# Patient Record
Sex: Male | Born: 1965 | Hispanic: Yes | Marital: Single | Smoking: Never smoker
Health system: Southern US, Community
[De-identification: ages and names within clinical notes are randomized; demographics above are authoritative.]

## PROBLEM LIST (undated history)

## (undated) DIAGNOSIS — E785 Hyperlipidemia, unspecified: Secondary | ICD-10-CM

## (undated) DIAGNOSIS — E875 Hyperkalemia: Secondary | ICD-10-CM

## (undated) DIAGNOSIS — R748 Abnormal levels of other serum enzymes: Secondary | ICD-10-CM

## (undated) DIAGNOSIS — R809 Proteinuria, unspecified: Secondary | ICD-10-CM

## (undated) DIAGNOSIS — Z6826 Body mass index (BMI) 26.0-26.9, adult: Secondary | ICD-10-CM

## (undated) DIAGNOSIS — R7989 Other specified abnormal findings of blood chemistry: Secondary | ICD-10-CM

## (undated) DIAGNOSIS — Z1331 Encounter for screening for depression: Secondary | ICD-10-CM

## (undated) DIAGNOSIS — R000 Tachycardia, unspecified: Secondary | ICD-10-CM

## (undated) DIAGNOSIS — E119 Type 2 diabetes mellitus without complications: Secondary | ICD-10-CM

## (undated) DIAGNOSIS — R5382 Chronic fatigue, unspecified: Secondary | ICD-10-CM

## (undated) DIAGNOSIS — R9431 Abnormal electrocardiogram [ECG] [EKG]: Secondary | ICD-10-CM

## (undated) DIAGNOSIS — K8020 Calculus of gallbladder without cholecystitis without obstruction: Secondary | ICD-10-CM

## (undated) DIAGNOSIS — E1129 Type 2 diabetes mellitus with other diabetic kidney complication: Secondary | ICD-10-CM

## (undated) DIAGNOSIS — U099 Post covid-19 condition, unspecified: Secondary | ICD-10-CM

## (undated) DIAGNOSIS — Z6827 Body mass index (BMI) 27.0-27.9, adult: Secondary | ICD-10-CM

## (undated) DIAGNOSIS — I10 Essential (primary) hypertension: Secondary | ICD-10-CM

## (undated) HISTORY — DX: Hyperlipidemia, unspecified: E78.5

## (undated) HISTORY — DX: Post covid-19 condition, unspecified: U09.9

## (undated) HISTORY — DX: Abnormal electrocardiogram (ECG) (EKG): R94.31

## (undated) HISTORY — DX: Abnormal levels of other serum enzymes: R74.8

## (undated) HISTORY — DX: Proteinuria, unspecified: R80.9

## (undated) HISTORY — DX: Body mass index (BMI) 27.0-27.9, adult: Z68.27

## (undated) HISTORY — DX: Type 2 diabetes mellitus with other diabetic kidney complication: E11.29

## (undated) HISTORY — DX: Encounter for screening for depression: Z13.31

## (undated) HISTORY — DX: Essential (primary) hypertension: I10

## (undated) HISTORY — DX: Other specified abnormal findings of blood chemistry: R79.89

## (undated) HISTORY — DX: Calculus of gallbladder without cholecystitis without obstruction: K80.20

## (undated) HISTORY — DX: Tachycardia, unspecified: R00.0

## (undated) HISTORY — DX: Body mass index (BMI) 26.0-26.9, adult: Z68.26

## (undated) HISTORY — DX: Hyperkalemia: E87.5

## (undated) HISTORY — DX: Chronic fatigue, unspecified: R53.82

---

## 2019-03-16 DIAGNOSIS — E119 Type 2 diabetes mellitus without complications: Secondary | ICD-10-CM

## 2019-03-16 DIAGNOSIS — I10 Essential (primary) hypertension: Secondary | ICD-10-CM

## 2019-03-16 DIAGNOSIS — N132 Hydronephrosis with renal and ureteral calculous obstruction: Secondary | ICD-10-CM

## 2019-08-20 ENCOUNTER — Emergency Department (HOSPITAL_BASED_OUTPATIENT_CLINIC_OR_DEPARTMENT_OTHER)
Admission: EM | Admit: 2019-08-20 | Discharge: 2019-08-20 | Disposition: A | Discharge status: Home / Self Care | Payer: Worker's Compensation | Attending: Emergency Medicine | Admitting: Emergency Medicine

## 2019-08-20 ENCOUNTER — Emergency Department (HOSPITAL_BASED_OUTPATIENT_CLINIC_OR_DEPARTMENT_OTHER): Payer: Worker's Compensation

## 2019-08-20 ENCOUNTER — Encounter (HOSPITAL_BASED_OUTPATIENT_CLINIC_OR_DEPARTMENT_OTHER): Payer: Self-pay | Admitting: Emergency Medicine

## 2019-08-20 ENCOUNTER — Other Ambulatory Visit: Payer: Self-pay

## 2019-08-20 DIAGNOSIS — Y9289 Other specified places as the place of occurrence of the external cause: Secondary | ICD-10-CM | POA: Diagnosis not present

## 2019-08-20 DIAGNOSIS — S60944A Unspecified superficial injury of right ring finger, initial encounter: Secondary | ICD-10-CM | POA: Diagnosis present

## 2019-08-20 DIAGNOSIS — E119 Type 2 diabetes mellitus without complications: Secondary | ICD-10-CM | POA: Diagnosis not present

## 2019-08-20 DIAGNOSIS — S62664A Nondisplaced fracture of distal phalanx of right ring finger, initial encounter for closed fracture: Secondary | ICD-10-CM | POA: Insufficient documentation

## 2019-08-20 DIAGNOSIS — Y999 Unspecified external cause status: Secondary | ICD-10-CM | POA: Diagnosis not present

## 2019-08-20 DIAGNOSIS — L03011 Cellulitis of right finger: Secondary | ICD-10-CM | POA: Diagnosis not present

## 2019-08-20 DIAGNOSIS — Y9389 Activity, other specified: Secondary | ICD-10-CM | POA: Insufficient documentation

## 2019-08-20 DIAGNOSIS — W499XXA Exposure to other inanimate mechanical forces, initial encounter: Secondary | ICD-10-CM | POA: Insufficient documentation

## 2019-08-20 DIAGNOSIS — S62639A Displaced fracture of distal phalanx of unspecified finger, initial encounter for closed fracture: Secondary | ICD-10-CM

## 2019-08-20 HISTORY — DX: Type 2 diabetes mellitus without complications: E11.9

## 2019-08-20 LAB — CBG MONITORING, ED: Glucose-Capillary: 335 mg/dL — ABNORMAL HIGH (ref 70–99)

## 2019-08-20 IMAGING — CR DG FINGER RING 2+V*R*
3 series · 3 of 3 positions shown · non-contrast
Comparison: None.

CLINICAL DATA: Crush injury.  Pain.

EXAM:
RIGHT RING FINGER 2+V

[x finger pa right]
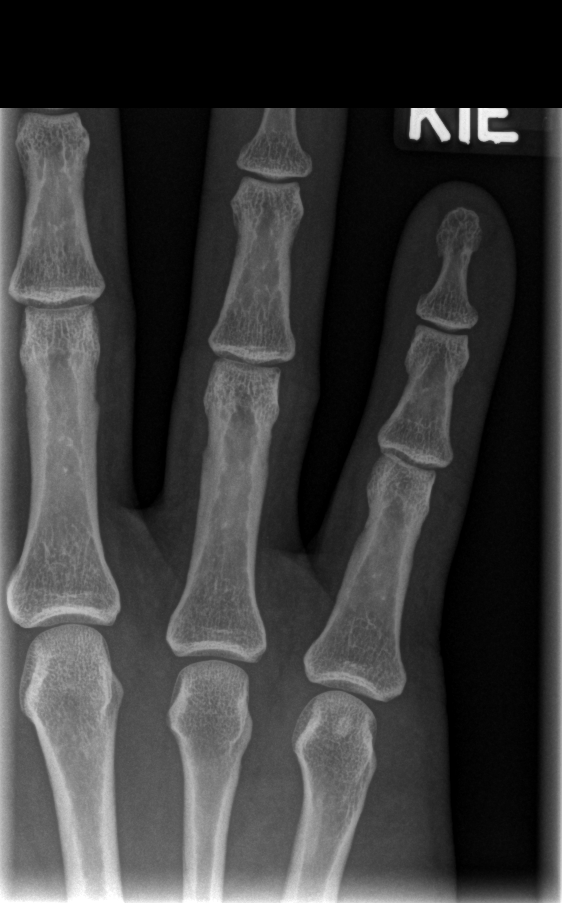

[x finger obl. right]
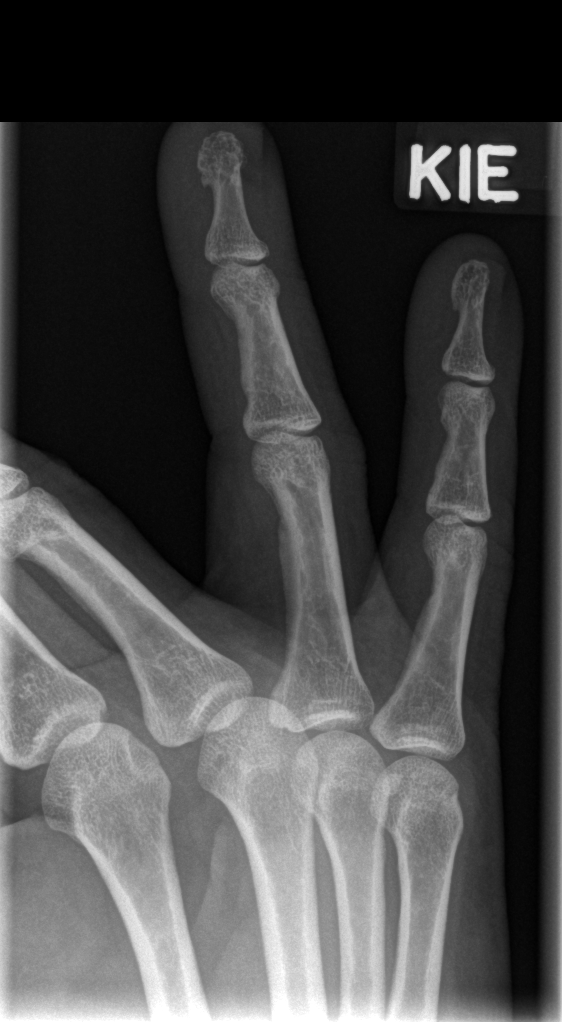

[x finger lateral right]
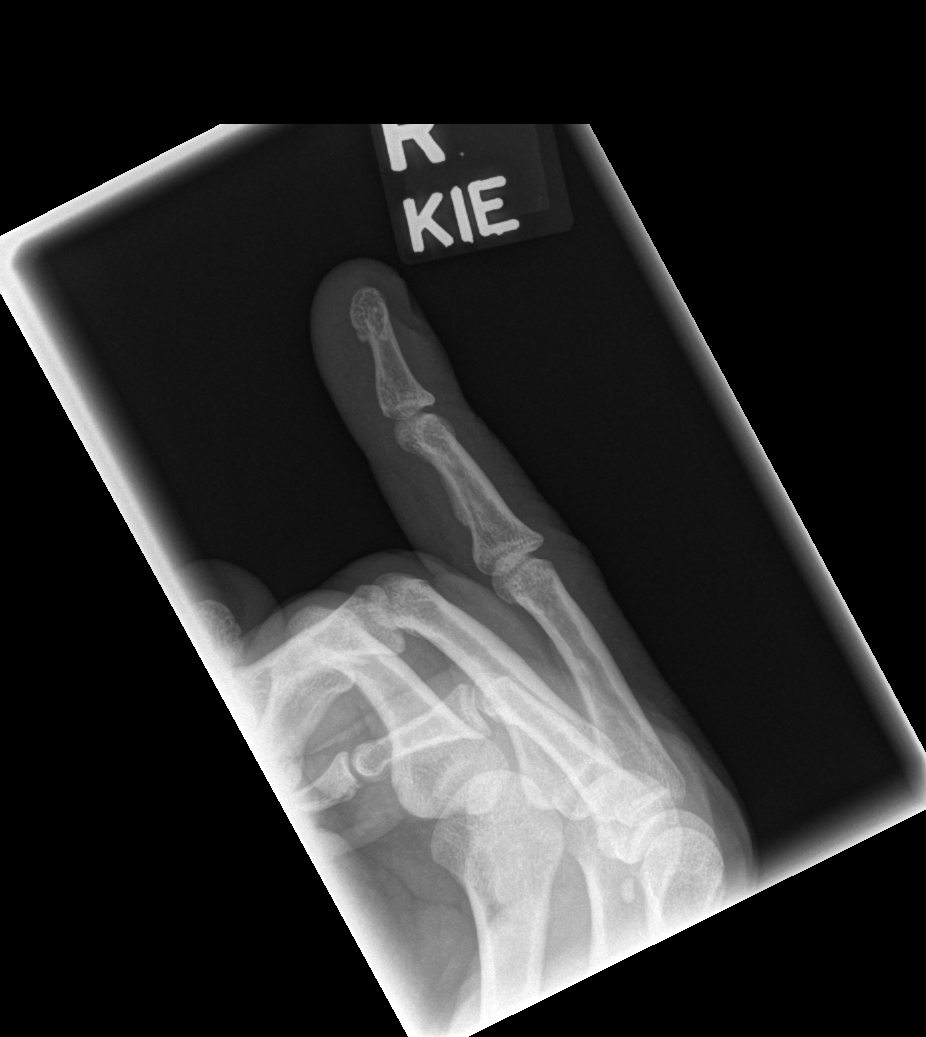

[3 of 3 positions shown; findings below may reference images not displayed]

FINDINGS: Soft tissue swelling is present in the distal aspect of the ring
finger. A nondisplaced fracture is present in the tuft. No
radiopaque foreign body is present.
IMPRESSION: Nondisplaced fracture in the tuft of the ring finger with associated
soft tissue swelling.

## 2019-08-20 MED ORDER — LIDOCAINE HCL 2 % IJ SOLN
5.0000 mL | Freq: Once | INTRAMUSCULAR | Status: AC
  Administered 2019-08-20: 100 mg via INTRADERMAL
  Filled 2019-08-20: qty 20

## 2019-08-20 NOTE — ED Provider Notes (Signed)
[facility] EMERGENCY DEPARTMENT Provider Note   CSN: [id number] Arrival date & time: 08/20/19  1132     History Chief Complaint  Patient presents with  . Finger Injury    Joshua Nash is a 54 y.o. male.  Patient with history of diabetes presents to the emergency department today for swelling of his right ring finger.  Patient sustained a crush injury on 08/13/2019 after he pinched his finger under a door frame.  Patient states that he saw a doctor on 08/18/2019. (I can see this encounter in epic, I cannot tell what medications he was prescribed).  Patient states that at that visit he was given a tetanus shot and started on antibiotic which she takes 3 times a day.  He does not know the name of the antibiotic.  He went to a "compensation [facility]" (workers comp appt?)  Today at 11 AM.  He was told that he needed to come to the emergency department for further evaluation.  He states that he has a follow-up appointment on Monday.  Patient has been able to express purulent drainage from the finger.  He states that the doctor earlier today put a needle in there and drained some of the fluid out.  Spanish interpreter used to take history.        Past Medical History:  Diagnosis Date  . Diabetes mellitus without complication (HCC)     There are no problems to display for this patient.   History reviewed. No pertinent surgical history.     History reviewed. No pertinent family history.  Social History   Tobacco Use  . Smoking status: Never Smoker  . Smokeless tobacco: Never Used  Substance Use Topics  . Alcohol use: Yes    Comment: occ  . Drug use: Never    Home Medications Prior to Admission medications   Not on File    Allergies    Patient has no known allergies.  Review of Systems   Review of Systems  Constitutional: Negative for activity change.  Musculoskeletal: Positive for arthralgias. Negative for joint swelling.  Skin: Positive for color  change and wound.  Neurological: Negative for weakness and numbness.    Physical Exam Updated Vital Signs BP (!) 142/95 (BP Location: Left Arm)   Pulse 100   Temp 98.8 F (37.1 C) (Oral)   Resp 18   Ht 5\' 5"  (1.651 m)   Wt 76.3 kg   SpO2 98%   BMI 28.01 kg/m   Physical Exam Vitals and nursing note reviewed.  Constitutional:      Appearance: He is well-developed.  HENT:     Head: Normocephalic and atraumatic.  Eyes:     Conjunctiva/sclera: Conjunctivae normal.  Pulmonary:     Effort: No respiratory distress.  Musculoskeletal:     Cervical back: Normal range of motion and neck supple.  Skin:    General: Skin is warm and dry.     Comments: Patient with erythema and swelling distal to the DIP joint of the right ring finger.  I am able to express a small amount of purulent drainage with pressure placed over the fingertip.  The volar aspect appears normal.  No signs of felon.  X-ray does show nondisplaced tuft fracture.  Neurological:     Mental Status: He is alert.              ED Results / Procedures / Treatments   Labs (all labs ordered are listed, but only abnormal results are  displayed) Labs Reviewed  CBG MONITORING, ED - Abnormal; Notable for the following components:      Result Value   Glucose-Capillary 335 (*)    All other components within normal limits    EKG None  Radiology DG Finger Ring Right  Result Date: 08/20/2019 CLINICAL DATA:  Crush injury.  Pain. EXAM: RIGHT RING FINGER 2+V COMPARISON:  None. FINDINGS: Soft tissue swelling is present in the distal aspect of the ring finger. A nondisplaced fracture is present in the tuft. No radiopaque foreign body is present. IMPRESSION: Nondisplaced fracture in the tuft of the ring finger with associated soft tissue swelling. Electronically Signed   By: Marin Roberts M.D.   On: 08/20/2019 13:00    Procedures .Marland KitchenIncision and Drainage  Date/Time: 08/20/2019 4:24 PM Performed by: Renne Crigler,  PA-C Authorized by: Renne Crigler, PA-C   Consent:    Consent obtained:  Verbal   Consent given by:  Patient   Risks discussed:  Infection and pain   Alternatives discussed:  No treatment Location:    Type:  Abscess   Size:  Small   Location:  Upper extremity   Upper extremity location:  Finger   Finger location:  R ring finger Pre-procedure details:    Skin preparation:  Betadine Anesthesia (see MAR for exact dosages):    Anesthesia method:  Local infiltration   Local anesthetic:  Lidocaine 2% w/o epi Procedure type:    Complexity:  Simple Procedure details:    Incision types:  Stab incision   Scalpel blade:  11   Wound management:  Probed and deloculated   Drainage:  Purulent and bloody   Drainage amount:  Scant   Wound treatment:  Drain placed   Packing materials:  1/4 in gauze   Amount 1/4":  1cm Post-procedure details:    Patient tolerance of procedure:  Tolerated well, no immediate complications   (including critical care time)  Medications Ordered in ED Medications  lidocaine (XYLOCAINE) 2 % (with pres) injection 100 mg (100 mg Intradermal Given 08/20/19 1521)    ED Course  I have reviewed the triage vital signs and the nursing notes.  Pertinent labs & imaging results that were available during my care of the patient were reviewed by me and considered in my medical decision making (see chart for details).  Patient seen and examined. CBG 335. Will perform more extensive I&D. Discussed risks/benefits with patient and he agrees to proceed.   Vital signs reviewed and are as follows: BP (!) 169/106 (BP Location: Left Arm)   Pulse 96   Temp 98.8 F (37.1 C) (Oral)   Resp 16   Ht 5\' 5"  (1.651 m)   Wt 76.3 kg   SpO2 100%   BMI 28.01 kg/m   Discussed patient with Dr. .   Discussed wound care, follow-up, and return instructions with patient using Spanish interpreter.  We discussed that he needs to continue to take his oral antibiotics and diabetes  medications (Metformin bid) as directed.  He needs to follow-up on Monday as scheduled.  He can have the packing placed here removed at that time, however no need to return if packing is removed by accident.  Discussed that he should return the emergency department for worsening pain, redness that spreads up the finger onto the hand, severe pain with movement, fever.  He was x-ray results and will be placed in a finger splint.    MDM Rules/Calculators/A&P  Patient who sustained crush injury on 6/4 to the finger.  Subsequent to this, over the past 3 to 4 days he developed an infection in the finger.  This appears to be a simple paronychia.  No felon.  I have low suspicion for developing osteomyelitis.  Patient will require close follow-up.  Wide I&D performed with packing placement in the ED today.  He is already on antibiotics.  Tetanus is already been updated per his history.  He has follow-up in 3 days for recheck.  Strict return instructions as above.   Final Clinical Impression(s) / ED Diagnoses Final diagnoses:  Paronychia of finger of right hand  Closed fracture of tuft of distal phalanx of finger    Rx / DC Orders ED Discharge Orders    None       Carlisle Cater, PA-C 08/20/19 [place], DO 08/20/19 2206

## 2019-08-20 NOTE — ED Triage Notes (Signed)
Pt here with right ring finger pain since this weekend after a crush injury at work. Finger is bruised and swollen and painful. Spanish interpretation is needed.

## 2019-08-20 NOTE — Discharge Instructions (Signed)
Please read and follow all provided instructions.  Your diagnoses today include:  1. Paronychia of finger of right hand   2. Closed fracture of tuft of distal phalanx of finger     Tests performed today include:  Vital signs. See below for your results today.   Medications prescribed:   None  Take any prescribed medications only as directed.   Home care instructions:  Follow any educational materials contained in this packet. Keep affected area above the level of your heart when possible. Wash area gently twice a day with warm soapy water. Do not apply alcohol or hydrogen peroxide. Cover the area if it draining or weeping.   Continue take your diabetes medications and antibiotics as prescribed.  Follow-up instructions: See your doctor on Monday as planned.   Return instructions:  Return to the Emergency Department if you have:  Fever  Worsening symptoms  Worsening pain  Worsening swelling  Redness of the skin that moves away from the affected area, especially if it streaks away from the affected area   Any other emergent concerns  Your vital signs today were: BP (!) 168/112 (BP Location: Left Arm)   Pulse 97   Temp 98.8 F (37.1 C) (Oral)   Resp 18   Ht 5\' 5"  (1.651 m)   Wt 76.3 kg   SpO2 100%   BMI 28.01 kg/m  If your blood pressure (BP) was elevated above 135/85 this visit, please have this repeated by your doctor within one month. --------------

## 2020-02-09 HISTORY — PX: LEG SURGERY: SHX1003

## 2020-03-18 DIAGNOSIS — U071 COVID-19: Secondary | ICD-10-CM

## 2020-03-18 HISTORY — DX: COVID-19: U07.1

## 2020-04-26 LAB — POCT GLUCOSE (2 HR PP)

## 2020-04-28 DIAGNOSIS — R5382 Chronic fatigue, unspecified: Secondary | ICD-10-CM

## 2020-05-29 ENCOUNTER — Encounter: Payer: Self-pay | Admitting: General Practice

## 2020-05-31 ENCOUNTER — Encounter: Payer: Self-pay | Admitting: *Deleted

## 2020-05-31 ENCOUNTER — Encounter: Payer: Self-pay | Admitting: Cardiology

## 2020-05-31 DIAGNOSIS — Z1331 Encounter for screening for depression: Secondary | ICD-10-CM | POA: Insufficient documentation

## 2020-05-31 DIAGNOSIS — E1159 Type 2 diabetes mellitus with other circulatory complications: Secondary | ICD-10-CM | POA: Insufficient documentation

## 2020-05-31 DIAGNOSIS — R5382 Chronic fatigue, unspecified: Secondary | ICD-10-CM | POA: Insufficient documentation

## 2020-05-31 DIAGNOSIS — R000 Tachycardia, unspecified: Secondary | ICD-10-CM | POA: Insufficient documentation

## 2020-05-31 DIAGNOSIS — E119 Type 2 diabetes mellitus without complications: Secondary | ICD-10-CM | POA: Insufficient documentation

## 2020-05-31 DIAGNOSIS — E1129 Type 2 diabetes mellitus with other diabetic kidney complication: Secondary | ICD-10-CM | POA: Insufficient documentation

## 2020-05-31 DIAGNOSIS — R9431 Abnormal electrocardiogram [ECG] [EKG]: Secondary | ICD-10-CM | POA: Insufficient documentation

## 2020-05-31 DIAGNOSIS — R7989 Other specified abnormal findings of blood chemistry: Secondary | ICD-10-CM | POA: Insufficient documentation

## 2020-05-31 DIAGNOSIS — Z6827 Body mass index (BMI) 27.0-27.9, adult: Secondary | ICD-10-CM | POA: Insufficient documentation

## 2020-05-31 DIAGNOSIS — Z6826 Body mass index (BMI) 26.0-26.9, adult: Secondary | ICD-10-CM | POA: Insufficient documentation

## 2020-05-31 DIAGNOSIS — I10 Essential (primary) hypertension: Secondary | ICD-10-CM | POA: Insufficient documentation

## 2020-05-31 DIAGNOSIS — U099 Post covid-19 condition, unspecified: Secondary | ICD-10-CM | POA: Insufficient documentation

## 2020-06-01 ENCOUNTER — Encounter: Payer: Self-pay | Admitting: Cardiology

## 2020-06-01 ENCOUNTER — Ambulatory Visit (INDEPENDENT_AMBULATORY_CARE_PROVIDER_SITE_OTHER): Payer: Medicaid Other | Admitting: Cardiology

## 2020-06-01 ENCOUNTER — Other Ambulatory Visit: Payer: Self-pay

## 2020-06-01 VITALS — BP 142/86 | HR 105 | Ht 65.0 in | Wt 167.6 lb

## 2020-06-01 DIAGNOSIS — R809 Proteinuria, unspecified: Secondary | ICD-10-CM

## 2020-06-01 DIAGNOSIS — I10 Essential (primary) hypertension: Secondary | ICD-10-CM | POA: Diagnosis not present

## 2020-06-01 DIAGNOSIS — I5189 Other ill-defined heart diseases: Secondary | ICD-10-CM | POA: Insufficient documentation

## 2020-06-01 DIAGNOSIS — R7989 Other specified abnormal findings of blood chemistry: Secondary | ICD-10-CM | POA: Diagnosis not present

## 2020-06-01 DIAGNOSIS — I209 Angina pectoris, unspecified: Secondary | ICD-10-CM

## 2020-06-01 DIAGNOSIS — E1129 Type 2 diabetes mellitus with other diabetic kidney complication: Secondary | ICD-10-CM | POA: Diagnosis not present

## 2020-06-01 DIAGNOSIS — I259 Chronic ischemic heart disease, unspecified: Secondary | ICD-10-CM

## 2020-06-01 DIAGNOSIS — R079 Chest pain, unspecified: Secondary | ICD-10-CM

## 2020-06-01 DIAGNOSIS — E119 Type 2 diabetes mellitus without complications: Secondary | ICD-10-CM | POA: Diagnosis not present

## 2020-06-01 HISTORY — DX: Angina pectoris, unspecified: I20.9

## 2020-06-01 HISTORY — DX: Chest pain, unspecified: R07.9

## 2020-06-01 HISTORY — DX: Other ill-defined heart diseases: I51.89

## 2020-06-01 MED ORDER — NITROGLYCERIN 0.4 MG SL SUBL: 0.4000 mg | SUBLINGUAL_TABLET | SUBLINGUAL | 6 refills | Status: DC | PRN

## 2020-06-01 NOTE — Addendum Note (Signed)
Addended by: Eleonore Chiquito on: 06/01/2020 04:33 PM   Modules accepted: Orders

## 2020-06-01 NOTE — Progress Notes (Signed)
Cardiology Office Note:    Date:  06/01/2020   ID:  Joshua Nash, DOB Feb 23, 1966, MRN [id number]  PCP:  Charlott Rakes, MD  Cardiologist:  Garwin Brothers, MD   Referring MD: Charlott Rakes, MD    ASSESSMENT:    1. Primary hypertension   2. Type 2 diabetes mellitus with proteinuria (HCC)   3. Elevated liver function tests   4. Diabetes mellitus without complication (HCC)   5. Angina pectoris (HCC)   6. Cardiac mass    PLAN:    In order of problems listed above:  1. Angina pectoris: I discussed my findings with the patient extensively.  His symptoms are very concerning I discussed invasive and noninvasive evaluation.  He prefers CT coronary angiography with FFR.  I will get a copy of all his blood work from his primary care and set it up for him.  There is history of elevated LFTs.  I also made the following recommendations to him.  He will use nitroglycerin on a as needed basis and prescription was sent.  He is taking aspirin on a daily basis currently.  Is not on statins because of history of elevated LFTs and I do not have those copy of those reports. 2. Diabetes mellitus: Managed by primary care provider.  Diet emphasized 3. 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  I discussed TEE and cardiac MRI and he prefers cardiac MRI for this assessment and will schedule him for this. 4. Elevated LFTs: For this reason patient I think is not on statin therapy.  This is managed by his primary care physician. 5. He knows to go to the nearest emergency room for any concerning symptoms.  Sublingual nitroglycerin prescription was sent, its protocol and 911 protocol explained and the patient vocalized understanding questions were answered to the patient's satisfaction. 6. Patient will be seen in follow-up appointment in 2 months or earlier if the patient has any concerns 7. Patient had multiple  questions which were answered to satisfaction.   Medication Adjustments/Labs and Tests Ordered: Current medicines are reviewed at length with the patient today.  Concerns regarding medicines are outlined above.  No orders of the defined types were placed in this encounter.  No orders of the defined types were placed in this encounter.    History of Present Illness:    Joshua Nash is a 55 y.o. male who is being seen today for the evaluation of chest tightness on exertion at the request of Charlott Rakes, MD.  Patient is a pleasant 55 year old male.  He has past medical history of essential hypertension dyslipidemia and diabetes mellitus.  Patient mentions to me that he is here because he has shortness of breath on exertion this is getting worse.  He also has some chest tightness on exertion.  No orthopnea or PND.  He works full-time but does not exercise on a regular basis.  He tells me that he is not sexually active because of erectile dysfunction.  He is Spanish-speaking and interpretation was done by official interpreter.  At the time of my evaluation, the patient is alert awake oriented and in no distress.  Past Medical History:  Diagnosis Date  . Abnormal electrocardiogram (ECG) (EKG)   . BMI 26.0-26.9,adult   . BMI 27.0-27.9,adult   . COVID-19 03/18/2020  . Diabetes mellitus without complication (HCC)   . Elevated liver function tests   . Hypertension   .  Post-COVID chronic fatigue   . Screening for depression   . Tachycardia   . Type 2 diabetes mellitus with proteinuria (HCC)   . Type 2 diabetes mellitus with proteinuria [facility])     Past Surgical History:  Procedure Laterality Date  . LEG SURGERY  02/2020    Current Medications: Current Meds  Medication Sig  . amLODipine (NORVASC) 10 MG tablet Take 1 tablet by mouth daily.  . empagliflozin (JARDIANCE) 25 MG TABS tablet Take 25 mg by mouth daily.  . hydrOXYzine (ATARAX/VISTARIL) 25 MG tablet Take 25 mg by mouth  3 (three) times daily as needed for itching.  . insulin glargine (LANTUS) 100 UNIT/ML injection Inject 40 Units into the skin daily.  Marland Kitchen lisinopril (ZESTRIL) 10 MG tablet Take 10 mg by mouth daily.     Allergies:   Patient has no known allergies.   Social History   Socioeconomic History  . Marital status: Single    Spouse name: Not on file  . Number of children: Not on file  . Years of education: Not on file  . Highest education level: Not on file  Occupational History  . Not on file  Tobacco Use  . Smoking status: Never Smoker  . Smokeless tobacco: Never Used  Substance and Sexual Activity  . Alcohol use: Yes    Comment: occ  . Drug use: Never  . Sexual activity: Not on file  Other Topics Concern  . Not on file  Social History Narrative  . Not on file   Social Determinants of Health   Financial Resource Strain: Not on file  Food Insecurity: Not on file  Transportation Needs: Not on file  Physical Activity: Not on file  Stress: Not on file  Social Connections: Not on file     Family History: The patient's family history includes Diabetes in his father and mother; Hyperlipidemia in his father and mother; Hypertension in his father and mother.  ROS:   Please see the history of present illness.    All other systems reviewed and are negative.  EKGs/Labs/Other Studies Reviewed:    The following studies were reviewed today: EKG was sinus rhythm and nonspecific ST-T changes echocardiogram done at [facility] hospital revealed preserved systolic function and a density on the aortic valve suggesting a mass of unclear etiology.   Recent Labs: No results found for requested labs within last 8760 hours.  Recent Lipid Panel No results found for: CHOL, TRIG, HDL, CHOLHDL, VLDL, LDLCALC, LDLDIRECT  Physical Exam:    VS:  BP (!) 142/86   Pulse (!) 105   Ht 5\' 5"  (1.651 m)   Wt 167 lb 9.6 oz (76 kg)   SpO2 97%   BMI 27.89 kg/m     Wt Readings from Last 3 Encounters:   06/01/20 167 lb 9.6 oz (76 kg)  05/19/20 167 lb (75.8 kg)  08/20/19 168 lb 5 oz (76.3 kg)     GEN: Patient is in no acute distress HEENT: Normal NECK: No JVD; No carotid bruits LYMPHATICS: No lymphadenopathy CARDIAC: S1 S2 regular, 2/6 systolic murmur at the apex. RESPIRATORY:  Clear to auscultation without rales, wheezing or rhonchi  ABDOMEN: Soft, non-tender, non-distended MUSCULOSKELETAL:  No edema; No deformity  SKIN: Warm and dry NEUROLOGIC:  Alert and oriented x 3 PSYCHIATRIC:  Normal affect    Signed, 10/20/19, MD  06/01/2020 4:06 PM    [facility]

## 2020-06-01 NOTE — Patient Instructions (Signed)
Medication Instructions:  Your physician has recommended you make the following change in your medication:  Use nitroglicerina segn sea necesario para el dolor de pecho.  Use nitroglycerin as needed for chest pain.  *If you need a refill on your cardiac medications before your next appointment, please call your pharmacy*   Lab Work: Your physician recommends that you return for lab work in: 1 week before your CT scan or MRI. You need to have labs done when you are fasting.  You can come Monday through Friday 8:30 am to 12:00 pm and 1:15 to 4:30. You do not need to make an appointment as the order has already been placed. The labs you are going to have done are BMET, CBC, TSH, LFT and Lipids.   Su mdico recomienda que regrese para el anlisis de laboratorio en: 1 semana antes de su tomografa computarizada o resonancia magntica. Puede venir de lunes a viernes de 8:30 a. m. a 12:00 p. m. y de 1:15 a. m. a 4:30 p. m. No es necesario concertar cita ya que el pedido ya se ha realizado.   If you have labs (blood work) drawn today and your tests are completely normal, you will receive your results only by: Marland Kitchen MyChart Message (if you have MyChart) OR . A paper copy in the mail If you have any lab test that is abnormal or we need to change your treatment, we will call you to review the results.   Testing/Procedures: Su TC cardaca se programar en:  [place], [place] [phone]   Si est programado en [facility], llegue a la entrada principal [place] 30 minutos antes de la hora de inicio de la prueba. Dirjase al Departamento de Radiologa de Zacarias Pontes (primer piso) para registrarse y prepararse para la prueba.  Siga estas instrucciones cuidadosamente (a menos que se indique lo contrario):  Mantenga todos los medicamentos para la disfuncin erctil al menos 3  das (72 horas) antes de la prueba.  En la noche antes de la prueba: Asegrese de beber [employer] Corporation. No consuma bebidas con cafena/descafeinadas ni chocolate 12 horas antes de la prueba. No tome ningn antihistamnico 12 horas antes de su prueba.  El da de la prueba: Electronics engineer abundante agua. No beba agua dentro de una hora de la prueba. No ingiera ningn alimento 4 horas antes de la prueba. Puede tomar sus medicamentos habituales antes de la prueba. Tome metoprolol (Toprol) dos horas antes de la prueba  Despus de la prueba: Rochel Brome abundante agua. Despus de recibir QUALCOMM, es posible que experimente una leve sensacin de enrojecimiento. Esto es normal. En ocasiones, puede experimentar un sarpullido leve hasta 24 horas despus de la prueba. Esto no es peligroso. Si esto ocurre, puede tomar Benadryl 25 mg y aumentar su ingesta de lquidos. Si experimenta problemas para respirar, esto puede ser grave. Si es grave llame al 911 INMEDIATAMENTE. Si es leve, llame a nuestra oficina. Si toma alguno de estos medicamentos: glipizida/metformina, Avandament, Glucavance, no lo tome 48 horas despus de completar la prueba, a menos que se le indique lo contrario.   Una vez que hayamos confirmado la autorizacin de su compaa de seguros, lo llamaremos para programar una fecha y hora para su prueba. Segn la rapidez con la que su seguro procese las solicitudes de autorizacin previa, espere hasta 4 semanas para que lo contactemos para Risk manager  su cita de TC cardaca. Tenga en cuenta que las citas de TC cardaca de rutina se pueden programar hasta 8 semanas despus de que su proveedor las [place].  Para preguntas no relacionadas con la programacin, comunquese con la enfermera navegadora de imgenes cardacas si tiene alguna pregunta o inquietud: Marchia Bond, enfermera navegadora de imgenes cardacas Merle Tai, Enfermera Navegadora Interina de Imgenes Cardacas Servicios vasculares y  cardacos de [place] Llamada directa a la oficina: [phone]  Para necesidades de programacin, incluidas cancelaciones y reprogramaciones, llame a Rogue Jury [phone].  Your cardiac CT will be scheduled at:   [facility] [address], [place] [address] [phone]   If scheduled at [facility], please arrive at the [facility] main entrance of [facility] 30 minutes prior to test start time. Proceed to the [facility] Radiology Department (first floor) to check-in and test prep.  Please follow these instructions carefully (unless otherwise directed):  Hold all erectile dysfunction medications at least 3 days (72 hrs) prior to test.  On the Night Before the Test: . Be sure to Drink plenty of water. . Do not consume any caffeinated/decaffeinated beverages or chocolate 12 hours prior to your test. . Do not take any antihistamines 12 hours prior to your test.  On the Day of the Test: . Drink plenty of water. Do not drink any water within one hour of the test. . Do not eat any food 4 hours prior to the test. . You may take your regular medications prior to the test.  . Take metoprolol (Toprol) two hours prior to test.  After the Test: . Drink plenty of water. . After receiving IV contrast, you may experience a mild flushed feeling. This is normal. . On occasion, you may experience a mild rash up to 24 hours after the test. This is not dangerous. If this occurs, you can take Benadryl 25 mg and increase your fluid intake. . If you experience trouble breathing, this can be serious. If it is severe call 911 IMMEDIATELY. If it is mild, please call our office. . If you take any of these medications: Glipizide/Metformin, Avandament, Glucavance, please do not take 48 hours after completing test unless otherwise instructed.   Once we have confirmed authorization from your insurance company, we will call you to set up a date and time for your  test. Based on how quickly your insurance processes prior authorizations requests, please allow up to 4 weeks to be contacted for scheduling your Cardiac CT appointment. Be advised that routine Cardiac CT appointments could be scheduled as many as 8 weeks after your provider has ordered it.  For non-scheduling related questions, please contact the cardiac imaging nurse navigator should you have any questions/concerns: Marchia Bond, Cardiac Imaging Nurse Navigator Burley Saver, Interim Cardiac Imaging Nurse [place] and [facility] Services Direct Office Dial: [phone]   For scheduling needs, including cancellations and rescheduling, please call Vivien Rota at [phone].     Follow-Up: At [facility], you and your health needs are our priority.  As part of our continuing mission to provide you with exceptional heart care, we have created designated Provider Care Teams.  These Care Teams include your primary Cardiologist (physician) and Advanced Practice Providers (APPs -  Physician Assistants and Nurse Practitioners) who all work together to provide you with the care you need, when you need it.  We recommend signing up for the patient portal called "MyChart".  Sign up  information is provided on this After Visit Summary.  MyChart is used to connect with patients for Virtual Visits (Telemedicine).  Patients are able to view lab/test results, encounter notes, upcoming appointments, etc.  Non-urgent messages can be sent to your provider as well.   To learn more about what you can do with MyChart, go to [email or web address].    Your next appointment:   1 month(s)  The format for your next appointment:   In Person  Provider:   Jyl Heinz, MD   Other Instructions  Health visitor Magnetic Resonance Imaging La resonancia magntica (RM) es un estudio indoloro que toma imgenes del interior del cuerpo. Este estudio utiliza un potente imn. No utiliza rayos X. Una RM  puede mostrar ms detalles acerca de un problema mdico que otras pruebas. Informe al mdico acerca de lo siguiente:  Cualquier alergia que tenga.  Todos los U[employer] Corporation toma. Esto incluye vitaminas, hierbas, gotas para los ojos, cremas y medicamentos de venta libre.  Cirugas a las que se haya sometido.  Cualquier problema mdico que tenga.  Cualquier pieza de metal que tenga en el organismo. Esta puede comprender lo siguiente: ? Cualquier articulacin nueva, como una rodilla o cadera hecha por el hombre (artificial). ? Cualquier dispositivo implantado, como un marcapasos. ? Un implante con metal en el odo (implante coclear). ? Una vlvula cardaca artificial. ? Un objeto del ojo que tenga metal. ? Esquirlas de metal. ? Piezas de una bala. ? Un puerto para insulina o quimioterapia.  Tatuajes.  Si tiene un implante anticonceptivo, como un DIU.  Si est embarazada o podra estarlo, o si est amamantando.  Cualquier temor a los espacios cerrados (claustrofobia). Es posible que le administren un medicamento para ayudarlo a Nurse, children's. Cules son los riesgos? Por lo general, se trata de un estudio seguro. Sin embargo, pueden ocurrir complicaciones. Esto incluye lo siguiente:  Si tiene metal en el cuerpo cerca de la zona que se est evaluando, puede ser difcil obtener imgenes claras.  Si est embarazada, debe evitar los estudios por RM, [employer] primeros tres meses de [place].  Si est amamantando y se va a usar un tinte durante la prueba, es posible que tenga que dejar de hacerlo hasta que el tinte se elimine del cuerpo.  Si se [place] un tinte, existe el riesgo de [place] reaccin alrgica al tinte. Puede tomar medicamentos para prevenir esta reaccin o para tratarla si tiene sntomas.  Si se [place] un tinte, este puede causar daos en los riones. Beber mucha agua antes y despus de la prueba puede ayudar a evitar esto. Qu ocurre antes de esta prueba?  Le pedirn que se  quite todos los objetos de metal que tenga. Esta puede comprender lo siguiente: ? El reloj, joyas y otros objetos de metal. ? Audfonos. ? Dentaduras postizas. ? Sostn con aro. ? [place].  Los aparatos de ortodoncia y las emplomaduras no son un problema.  Si est amamantando, pregunte al mdico si debe extraerse leche antes de la prueba. Es posible que tenga que dejar de Economist durante un tiempo si se va a usar un tinte. Qu ocurre durante la prueba?  Pueden darle auriculares o audfonos para que escuche msica. La mquina de RM puede ser ruidosa.  Deber recostarse en una camilla.  Si se utilizar un tinte, Firefighter un tubo (catter) intravenoso en una de las venas. Se lo administrarn a travs de un tubo (catter) intravenoso.  La camilla se deslizar hacia un tnel. El tnel tiene  imanes en su interior. Una vez dentro del tnel, usted podr seguir hablando con el mdico.  El tnel escanear su cuerpo y crear imgenes. Le pedirn que permanezca acostado y [place]. El mdico le dir cundo puede moverse.  Cuando se hayan tomado todas las imgenes, la camilla se deslizar hacia afuera del tnel. La prueba puede tardar entre 30 minutos y ms de [place]. El estudio puede variar segn el mdico y el hospital.   Qu puedo esperar despus de la prueba?  Si se le administr un medicamento para ayudarlo a Nurse, children's, es posible que se lo controle hasta que abandone el hospital o la [place]. Esto incluye controlar la presin arterial, la frecuencia cardaca y Personal assistant, y el nivel de oxgeno en la sangre.  Si se utiliz un tinte: ? Se eliminar del cuerpo a travs del pis (orina). Esto lleva aproximadamente un da. ? Es posible que le indiquen beber mucho lquido. Esto ayudar al cuerpo a eliminar el tinte. ? No amamante a un nio [employer] su mdico le diga que es [place]. Siga estas instrucciones en su casa:  Podr volver a sus actividades normales de inmediato, o como  se lo haya indicado el mdico.  Es su responsabilidad retirar los resultados de la prueba. Pregunte cmo obtener sus resultados cuando estn listos.  Cumpla con todas las visitas de seguimiento. Resumen  La Health visitor (RM) es un estudio indoloro que toma imgenes del interior del cuerpo.  El tinte se puede usar para obtener imgenes de RM que sean an ms claras.  Antes de la RM, asegrese de informar a su mdico sobre cualquier objeto metlico que pueda tener en el cuerpo.  Hable con el mdico sobre lo que significan los resultados de la prueba. Esta informacin no tiene Marine scientist el consejo del mdico. Asegrese de hacerle al mdico cualquier pregunta que tenga. Document Revised: 09/17/2019 Document Reviewed: 09/17/2019 Elsevier Patient Education  2021 [place] por tomografa computarizada (TC) Cardiac CT Angiogram El angiograma cardaco por TC es un procedimiento para estudiar el corazn y la zona que lo rodea. Podra realizarse para hallar la causa de dolores torcicos u otros sntomas de enfermedad cardaca. Durante este procedimiento, se inyecta una sustancia llamada tinte de contraste en los vasos sanguneos de la zona que se va a examinar. Un equipo de rayos X de gran tamao, llamado tomgrafo, captura imgenes detalladas del corazn y de la zona que lo rodea. El procedimiento tambin recibe otros nombres, como angiograma coronario por TC, exploracin de las arterias coronarias y Armed forces operational officer. Un angiograma cardaco por TC permite que el mdico vea cmo fluye la sangre desde y [place] corazn. El mdico podr ver si hay algn problema, como:  Obstruccin o estrechamiento de las arterias Conservation officer, nature.  Lquido alrededor del corazn.  Indicios de debilidad o enfermedad en los msculos, las vlvulas y los tejidos del corazn. Informe al mdico acerca de lo siguiente:  Cualquier alergia que tenga. Esto es importante, en  especial, si ya tuvo alguna reaccin alrgica al tinte de [place].  Todos los UAL Corporation [place], incluidos vitaminas, hierbas, gotas oftlmicas, cremas y medicamentos de venta libre.  Cualquier trastorno de la sangre que tenga.  Cirugas a las que se haya sometido.  Cualquier afeccin mdica que tenga.  Si est embarazada o podra estarlo.  Cualquier trastorno de ansiedad, Social research officer, government crnico u otras afecciones que tenga que pudieran aumentar su estrs o no permitir que permanezca recostado quieto. Cules  son los riesgos? En general, se trata de un procedimiento seguro. Sin embargo, pueden ocurrir complicaciones, por ejemplo:  Sangrado.  Infeccin.  Reacciones alrgicas a los medicamentos o a los tintes.  Daos a otras estructuras u rganos.  Dao renal debido al tinte de [place].  Aumento del riesgo de Chief Financial Officer por exposicin a la radiacin. Este riesgo es bajo. Converse con el mdico sobre lo siguiente: ? Los riesgos y [place]. ? Cmo puede recibir Scientist, research (life sciences) dosis posible de radiacin. Qu ocurre antes del procedimiento?  Use ropa cmoda y American Express, los anteojos, dentadura postiza y [place].  Siga las instrucciones del mdico respecto de las comidas y las bebidas. Pueden incluir: ? English as a second language teacher consumo de cafena durante las 12horas previas al procedimiento. Esta se encuentra en el t, el caf, los refrescos, las bebidas energizantes y las pldoras para Horticulturist, commercial. Beba mucha agua u otros lquidos que no contengan cafena. Estar bien hidratado puede evitar complicaciones. ? Dejar de comer y beber entre 4 y 66 horas antes del procedimiento. El tinte de contraste puede provocarle nuseas, pero esto es menos probable si tiene Product manager vaco.  Consulte al mdico si debe cambiar o suspender los medicamentos que toma habitualmente. Esto es PepsiCo, en especial, si toma medicamentos para tratar la diabetes, anticoagulantes o  medicamentos para tratar problemas de ereccin (disfuncin erctil). Qu ocurre durante el procedimiento?  Es posible que deba afeitarse el vello del pecho para que en esa zona puedan colocarse pequeos parches Merrill Lynch llamados electrodos. Estos transmitirn informacin que ayuda a Patent examiner.  Le colocarn una va intravenosa (IV) en una de las venas.  Podran administrarle un medicamento para controlar la frecuencia cardaca durante el procedimiento. Esto ayudar a garantizar que puedan obtenerse buenas imgenes.  Le pedirn que se acueste en una camilla. Esta camilla se deslizar hacia dentro y fuera del equipo de TC durante el procedimiento.  El tinte de [place] se Tour manager en la va intravenosa. Puede ser que sienta calor o un sabor metlico en la boca.  Le administrarn un medicamento llamado nitroglicerina. Este relajar o [place] arterias del corazn.  La camilla sobre la cual estar recostado se desplazar hacia el tnel del equipo de TC para que se realice la exploracin.  La persona a cargo del equipo le dar instrucciones mientras se lleve a cabo el estudio. Le pedirn que: ? Mantenga los brazos por encima de la cabeza. ? Contenga la respiracin. ? Se quede muy quieto, incluso si la camilla se mueve.  Cuando la exploracin haya finalizado, lo sacarn del equipo.  Se retirar el catter intravenoso. El procedimiento puede variar segn el mdico y el hospital.   Qu puedo esperar despus del procedimiento? Despus del procedimiento, es comn [employer] siguientes sntomas:  Un sabor metlico en la boca debido al tinte de [place].  Sensacin de calor.  Dolor de [place] a causa de Primary school teacher. Siga estas instrucciones en su casa:  Use los medicamentos de venta libre y los recetados solamente como se lo haya indicado el mdico.  Si se lo indican, beba suficiente lquido como para Theatre manager la orina de color amarillo  plido. Esto ayudar a eliminar el tinte de contraste del cuerpo.  La State Farm de las personas pueden reanudar sus actividades normales inmediatamente despus del procedimiento. Pregntele al mdico qu actividades son seguras para usted.  Es su responsabilidad retirar Gap Inc del procedimiento. Pregntele a su mdico, o a un  miembro del personal del departamento donde se realice el procedimiento, cundo estarn Praxair.  Concurra a todas las visitas de seguimiento como se lo haya indicado el mdico. Esto es importante. Comunquese con un mdico si:  Tiene sntomas de alergia al tinte de [place]. Esto incluye lo siguiente: ? Falta de aire. ? Erupcin cutnea o ronchas. ? Latidos cardacos acelerados. Resumen  El angiograma cardaco por TC es un procedimiento para estudiar el corazn y la zona que lo rodea. Podra realizarse para hallar la causa de dolores torcicos u otros sntomas de enfermedad cardaca.  Durante este procedimiento, un equipo de rayosX de gran tamao, llamado tomgrafo, captura imgenes detalladas del corazn y de la zona circundante despus de haberse inyectado un tinte de contraste en los vasos sanguneos de la zona.  Consulte al mdico acerca de cambiar o suspender los medicamentos que toma habitualmente, antes del procedimiento. Esto es PepsiCo, en especial, si toma medicamentos para tratar la diabetes, anticoagulantes o medicamentos para tratar la disfuncin erctil.  Si se lo indican, beba suficiente lquido como para mantener la orina de color amarillo plido. Esto ayudar a eliminar el tinte de contraste del cuerpo. Esta informacin no tiene Marine scientist el consejo del mdico. Asegrese de hacerle al mdico cualquier pregunta que tenga. Document Revised: 12/23/2018 Document Reviewed: 12/23/2018 Elsevier Patient Education  2021 [place]. Nitroglycerin sublingual tablets Qu es este medicamento? La NITROGLICERINA es un tipo  de vasodilatador. Relaja los vasos sanguneos e incrementa as el suministro de sangre y oxgeno al corazn. Este medicamento se utiliza para Best boy en el pecho causado por la angina de [place]. Puede utilizarse para Environmental education officer en el pecho antes de Optometrist actividades como subir escaleras, salir al exterior cuando hace fro o tener relaciones sexuales. Este medicamento puede ser utilizado para otros usos; si tiene alguna pregunta consulte con su proveedor de atencin mdica o con su farmacutico. Este medicamento puede ser utilizado para otros usos; si tiene alguna pregunta consulte con su proveedor de atencin mdica o con su farmacutico. MARCAS COMUNES: Nitroquick, Nitrostat, Nitrotab Qu le debo informar a mi profesional de la salud antes de tomar este medicamento? Necesita saber si usted presenta alguno de los W[employer] International o situaciones:  anemia  lesin de la cabeza, derrame cerebral reciente o hemorragia cerebral  enfermedad heptica  ataque cardiaco previo  una reaccin alrgica o inusual a la nitroglicerina, a otros medicamentos, alimentos, colorantes o conservantes  si est embarazada o buscando quedar embarazada  si est amamantando a un beb Cmo debo utilizar este medicamento? Tome este medicamento por va oral segn sea necesario. Al sentir los primeros signos de un ataque de angina (opresin o dolor en el pecho), colquese una tableta debajo de la lengua. Tambin puede tomar Coca-Cola 5  10 minutos antes de una situacin que puede provocarle dolor en el pecho. Siga las instrucciones de la etiqueta del [place]. Deje que la tableta se disuelva debajo de la lengua. No la trague entera. Reemplace la dosis si traga la tableta de manera accidental. Ser mejor que su boca no est seca. La saliva contribuir a disolver la tableta ms rpidamente. No coma, beba, fume ni mastique tabaco mientras la tableta se est disolviendo. Si no se siente mejor  dentre 5 minutos despus de tomar UNA dosis de nitroglicerina, llamar al numero 9-1-1 inmediatamento para buscar ayuda mdica de emergencia. No tome ms de 3 tabletas de nitroglicerina en el transcurro de 15 minutos. Si toma este  medicamento a menudo para [employer] de angina, su mdico o su profesional de la salud le pueden dar intrucciones diferentes para manejar sus sntomas. Si sus sntomas no desaparecen despus de seguir estas instrucciones, es importante llamar al 9-1-1 inmediatamente. No tome ms de 3 tabletas de nitroglicerina en el transcurro de 15 minutos. Hable con su pediatra para informarse acerca del uso de este medicamento en nios. Puede requerir atencin especial. Sobredosis: Pngase en contacto inmediatamente con un centro toxicolgico o una sala de urgencia si usted cree que haya tomado demasiado medicamento. ATENCIN: Con[employer]ra Foods es solo para usted. No comparta este medicamento con nadie. Sobredosis: Pngase en contacto inmediatamente con un centro toxicolgico o una sala de urgencia si usted cree que haya tomado demasiado medicamento. ATENCIN: [employer] Foods es solo para usted. No comparta este medicamento con nadie. Qu sucede si me olvido de una dosis? No se aplica en este caso. Este medicamento es slo para usar como sea necesario. Qu puede interactuar con este medicamento? No tome este medicamento con ninguno de los siguientes frmacos: -ciertos medicamentos para migraas, tales como ergotamina y dihidroergotamina  medicamentos utilizados para tratar disfuncin erctil, tales como avanafil, sildenafil, tadalafil y vardenafil  riociguat Este medicamento tambin puede interactuar con los siguientes medicamentos: -alteplasa  aspirina  heparina  medicamentos para la alta presin sangunea  medicamentos para la depresin mental  otros medicamentos utilizados para tratar la angina de pecho  fenotiazinas, tales como clorpromazina, mesoridazina,  Government social research officer, tioridazina Puede ser que esta lista no menciona todas las posibles interacciones. Informe a su profesional de [employer] Home	[place] de [employer] Corporation productos a base de hierbas, medicamentos de [place] o suplementos nutritivos que est tomando. Si usted fuma, consume bebidas alcohlicas o si utiliza drogas ilegales, indqueselo tambin a su profesional de KB Home	[place]. Algunas sustancias pueden interactuar con su medicamento. Puede ser que esta lista no menciona todas las posibles interacciones. Informe a su profesional de KB Home	[place] de [employer] Corporation productos a base de hierbas, medicamentos de [place] o suplementos nutritivos que est tomando. Si usted fuma, consume bebidas alcohlicas o si utiliza drogas ilegales, indqueselo tambin a su profesional de KB Home	[place]. Algunas sustancias pueden interactuar con su medicamento. A qu debo estar atento al usar Coca-Cola? Informe a su mdico o a su profesional de la salud si observa que el medicamento ya no surte [place]. Lleve este medicamento consigo en todo momento. Sintese o recustese para evitar caerse si siente mareos o sensacin de desmayo luego de ingerir Coca-Cola. Intente permanecer en calma. Esto le ayudar a sentirse mejor ms rpidamente. Si siente mareos, respire profundamente varias veces y acustese con los pies apoyados hacia arriba o inclnese hacia adelante y coloque la cabeza entre las rodillas. Puede experimentar mareos o somnolencia. No conduzca ni utilice maquinaria ni haga nada que Associate Professor en estado de alerta hasta que sepa cmo le afecta este medicamento. No se siente ni se ponga de pie con rapidez, especialmente si es un paciente de edad avanzada. Esto reduce el riesgo de mareos o [employer]. El alcohol puede aumentar los mareos y la somnolencia. Evite consumir bebidas alcohlicas. No se trate usted mismo si tiene tos, resfro o Nutritional therapist est tomando este medicamento sin Teacher, adult education con su mdico o con su  profesional de [employer]	[place]. Algunos ingredientes pueden aumentar los posibles efectos secundarios. Qu efectos secundarios puedo tener al [employer] este medicamento? Efectos secundarios que debe informar a su mdico o a Barrister's clerk de la salud tan  pronto como sea posible:  visin borrosa  boca seca  erupcin cutnea  sudoracin  sensacin de presin extrema en la cabeza  cansancio o debilidad inusual Efectos secundarios que, por lo general, no requieren atencin mdica (debe informarlos a su mdico o a su profesional de la salud si persisten o si son molestos):  enrojecimiento de la cara o cuello  dolor de cabeza  latidos cardiacos irregulares, palpitaciones  nuseas, vmito Puede ser que [employer] no menciona todos los posibles efectos secundarios. Comunquese a su mdico por asesoramiento mdico [employer] Usted puede informar los efectos secundarios a la FDA por telfono al 1-800-FDA-1088. Puede ser que esta lista no menciona todos los posibles efectos secundarios. Comunquese a su mdico por asesoramiento mdico [employer] Usted puede informar los efectos secundarios a la FDA por telfono al 1-800-FDA-1088. Dnde debo guardar mi medicina? Mantngala fuera del alcance de los nios. Gurdela a [employer], entre 20 y 31 grados C (64 y 23 grados F). Gurdela en su envase original. Protjala de la luz y de la humedad. Mantenga el envase bien cerrado. Deseche todo el medicamento que no haya utilizado, despus de la fecha de vencimiento.ATENCIN:Este folleto es un resumen. Puede ser que no cubra toda la posible informacin. Si usted tiene preguntas acerca de esta medicina, consulte con su mdico, su farmacutico o su profesional de Technical sales engineer. ATENCIN: Este folleto es un resumen. Puede ser que no cubra toda la posible informacin. Si usted tiene preguntas acerca de esta medicina, consulte con su mdico, su farmacutico o su profesional de  Technical sales engineer.  2021 Elsevier/Gold Standard (2016-03-28 00:00:00)

## 2020-06-02 ENCOUNTER — Telehealth: Payer: Self-pay | Admitting: Cardiology

## 2020-06-02 NOTE — Telephone Encounter (Signed)
Spoke with patient regarding preferred weekdays and times for scheduling the Cardiac MRI ordered by Dr. Rolm Baptise contact patient with appointment information as soon as we hear regarding the insurance prior authorization

## 2020-06-06 ENCOUNTER — Encounter: Payer: Self-pay | Admitting: Cardiology

## 2020-06-06 NOTE — Telephone Encounter (Signed)
Left message for patient regarding the Monday 07/10/20 8:00 am Cardiac MRI appointment at [facility]---arrival time is 7:30 am--1st floor admissions office for check in.  Will mail information to patient and requested he call with questions or concerns.

## 2020-06-19 ENCOUNTER — Emergency Department (HOSPITAL_COMMUNITY): Payer: Medicaid Other

## 2020-06-19 ENCOUNTER — Inpatient Hospital Stay (HOSPITAL_COMMUNITY)
Admission: EM | Admit: 2020-06-19 | Discharge: 2020-06-23 | DRG: 311 | Disposition: A | Discharge status: Home / Self Care | Payer: Medicaid Other | Attending: Family Medicine | Admitting: Family Medicine

## 2020-06-19 ENCOUNTER — Encounter: Payer: Self-pay | Admitting: Cardiology

## 2020-06-19 ENCOUNTER — Other Ambulatory Visit: Payer: Self-pay

## 2020-06-19 ENCOUNTER — Ambulatory Visit (INDEPENDENT_AMBULATORY_CARE_PROVIDER_SITE_OTHER): Payer: Medicaid Other | Admitting: Cardiology

## 2020-06-19 VITALS — BP 142/82 | HR 100 | Ht 65.0 in | Wt 162.4 lb

## 2020-06-19 DIAGNOSIS — Z20822 Contact with and (suspected) exposure to covid-19: Secondary | ICD-10-CM | POA: Diagnosis present

## 2020-06-19 DIAGNOSIS — E119 Type 2 diabetes mellitus without complications: Secondary | ICD-10-CM | POA: Diagnosis not present

## 2020-06-19 DIAGNOSIS — Z7984 Long term (current) use of oral hypoglycemic drugs: Secondary | ICD-10-CM

## 2020-06-19 DIAGNOSIS — E8809 Other disorders of plasma-protein metabolism, not elsewhere classified: Secondary | ICD-10-CM | POA: Diagnosis present

## 2020-06-19 DIAGNOSIS — E1165 Type 2 diabetes mellitus with hyperglycemia: Secondary | ICD-10-CM | POA: Diagnosis present

## 2020-06-19 DIAGNOSIS — I152 Hypertension secondary to endocrine disorders: Secondary | ICD-10-CM | POA: Diagnosis present

## 2020-06-19 DIAGNOSIS — N189 Chronic kidney disease, unspecified: Secondary | ICD-10-CM

## 2020-06-19 DIAGNOSIS — N179 Acute kidney failure, unspecified: Secondary | ICD-10-CM | POA: Diagnosis present

## 2020-06-19 DIAGNOSIS — R809 Proteinuria, unspecified: Secondary | ICD-10-CM | POA: Diagnosis present

## 2020-06-19 DIAGNOSIS — R160 Hepatomegaly, not elsewhere classified: Secondary | ICD-10-CM | POA: Diagnosis present

## 2020-06-19 DIAGNOSIS — R945 Abnormal results of liver function studies: Secondary | ICD-10-CM

## 2020-06-19 DIAGNOSIS — I200 Unstable angina: Principal | ICD-10-CM | POA: Diagnosis present

## 2020-06-19 DIAGNOSIS — D649 Anemia, unspecified: Secondary | ICD-10-CM | POA: Diagnosis present

## 2020-06-19 DIAGNOSIS — I5189 Other ill-defined heart diseases: Secondary | ICD-10-CM

## 2020-06-19 DIAGNOSIS — R1011 Right upper quadrant pain: Secondary | ICD-10-CM

## 2020-06-19 DIAGNOSIS — E1159 Type 2 diabetes mellitus with other circulatory complications: Secondary | ICD-10-CM | POA: Diagnosis present

## 2020-06-19 DIAGNOSIS — N289 Disorder of kidney and ureter, unspecified: Secondary | ICD-10-CM

## 2020-06-19 DIAGNOSIS — R0600 Dyspnea, unspecified: Secondary | ICD-10-CM

## 2020-06-19 DIAGNOSIS — Z8249 Family history of ischemic heart disease and other diseases of the circulatory system: Secondary | ICD-10-CM

## 2020-06-19 DIAGNOSIS — K8020 Calculus of gallbladder without cholecystitis without obstruction: Secondary | ICD-10-CM | POA: Diagnosis present

## 2020-06-19 DIAGNOSIS — L299 Pruritus, unspecified: Secondary | ICD-10-CM | POA: Diagnosis present

## 2020-06-19 DIAGNOSIS — U099 Post covid-19 condition, unspecified: Secondary | ICD-10-CM | POA: Diagnosis present

## 2020-06-19 DIAGNOSIS — Z6826 Body mass index (BMI) 26.0-26.9, adult: Secondary | ICD-10-CM

## 2020-06-19 DIAGNOSIS — I209 Angina pectoris, unspecified: Secondary | ICD-10-CM

## 2020-06-19 DIAGNOSIS — Z9889 Other specified postprocedural states: Secondary | ICD-10-CM

## 2020-06-19 DIAGNOSIS — E872 Acidosis: Secondary | ICD-10-CM | POA: Diagnosis present

## 2020-06-19 DIAGNOSIS — N183 Chronic kidney disease, stage 3 unspecified: Secondary | ICD-10-CM | POA: Diagnosis present

## 2020-06-19 DIAGNOSIS — R748 Abnormal levels of other serum enzymes: Secondary | ICD-10-CM

## 2020-06-19 DIAGNOSIS — E663 Overweight: Secondary | ICD-10-CM | POA: Diagnosis present

## 2020-06-19 DIAGNOSIS — E875 Hyperkalemia: Secondary | ICD-10-CM

## 2020-06-19 DIAGNOSIS — R5382 Chronic fatigue, unspecified: Secondary | ICD-10-CM | POA: Diagnosis present

## 2020-06-19 DIAGNOSIS — R0609 Other forms of dyspnea: Secondary | ICD-10-CM | POA: Diagnosis present

## 2020-06-19 DIAGNOSIS — E860 Dehydration: Secondary | ICD-10-CM | POA: Diagnosis present

## 2020-06-19 DIAGNOSIS — R079 Chest pain, unspecified: Secondary | ICD-10-CM | POA: Diagnosis not present

## 2020-06-19 DIAGNOSIS — E782 Mixed hyperlipidemia: Secondary | ICD-10-CM | POA: Diagnosis present

## 2020-06-19 DIAGNOSIS — Z833 Family history of diabetes mellitus: Secondary | ICD-10-CM

## 2020-06-19 DIAGNOSIS — E1129 Type 2 diabetes mellitus with other diabetic kidney complication: Secondary | ICD-10-CM | POA: Diagnosis present

## 2020-06-19 DIAGNOSIS — R7989 Other specified abnormal findings of blood chemistry: Secondary | ICD-10-CM

## 2020-06-19 DIAGNOSIS — Z794 Long term (current) use of insulin: Secondary | ICD-10-CM

## 2020-06-19 DIAGNOSIS — E1122 Type 2 diabetes mellitus with diabetic chronic kidney disease: Secondary | ICD-10-CM | POA: Diagnosis present

## 2020-06-19 DIAGNOSIS — K5900 Constipation, unspecified: Secondary | ICD-10-CM | POA: Diagnosis present

## 2020-06-19 DIAGNOSIS — N1832 Chronic kidney disease, stage 3b: Secondary | ICD-10-CM | POA: Diagnosis present

## 2020-06-19 DIAGNOSIS — Z83438 Family history of other disorder of lipoprotein metabolism and other lipidemia: Secondary | ICD-10-CM

## 2020-06-19 DIAGNOSIS — R81 Glycosuria: Secondary | ICD-10-CM | POA: Diagnosis present

## 2020-06-19 DIAGNOSIS — Z79899 Other long term (current) drug therapy: Secondary | ICD-10-CM

## 2020-06-19 DIAGNOSIS — N200 Calculus of kidney: Secondary | ICD-10-CM | POA: Diagnosis present

## 2020-06-19 HISTORY — DX: Disorder of kidney and ureter, unspecified: N28.9

## 2020-06-19 HISTORY — DX: Chronic kidney disease, unspecified: N18.9

## 2020-06-19 HISTORY — DX: Other forms of dyspnea: R06.09

## 2020-06-19 HISTORY — DX: Acute kidney failure, unspecified: N17.9

## 2020-06-19 LAB — CBC
HCT: 40.4 % (ref 39.0–52.0)
Hemoglobin: 12.5 g/dL — ABNORMAL LOW (ref 13.0–17.0)
MCH: 25.1 pg — ABNORMAL LOW (ref 26.0–34.0)
MCHC: 30.9 g/dL (ref 30.0–36.0)
MCV: 81.1 fL (ref 80.0–100.0)
Platelets: 335 10*3/uL (ref 150–400)
RBC: 4.98 MIL/uL (ref 4.22–5.81)
RDW: 17.4 % — ABNORMAL HIGH (ref 11.5–15.5)
WBC: 8.5 10*3/uL (ref 4.0–10.5)
nRBC: 0 % (ref 0.0–0.2)

## 2020-06-19 LAB — BASIC METABOLIC PANEL
Anion gap: 8 (ref 5–15)
Anion gap: 7 (ref 5–15)
BUN: 29 mg/dL — ABNORMAL HIGH (ref 6–20)
BUN: 25 mg/dL — ABNORMAL HIGH (ref 6–20)
CO2: 21 mmol/L — ABNORMAL LOW (ref 22–32)
CO2: 23 mmol/L (ref 22–32)
Calcium: 9.5 mg/dL (ref 8.9–10.3)
Calcium: 10.1 mg/dL (ref 8.9–10.3)
Chloride: 100 mmol/L (ref 98–111)
Chloride: 106 mmol/L (ref 98–111)
Creatinine, Ser: 1.9 mg/dL — ABNORMAL HIGH (ref 0.61–1.24)
Creatinine, Ser: 1.63 mg/dL — ABNORMAL HIGH (ref 0.61–1.24)
GFR, Estimated: 42 mL/min — ABNORMAL LOW (ref >60)
GFR, Estimated: 50 mL/min — ABNORMAL LOW (ref >60)
Glucose, Bld: 516 mg/dL (ref 70–99)
Glucose, Bld: 100 mg/dL — ABNORMAL HIGH (ref 70–99)
Potassium: 6.4 mmol/L (ref 3.5–5.1)
Potassium: 4.8 mmol/L (ref 3.5–5.1)
Sodium: 129 mmol/L — ABNORMAL LOW (ref 135–145)
Sodium: 136 mmol/L (ref 135–145)

## 2020-06-19 LAB — BRAIN NATRIURETIC PEPTIDE: B Natriuretic Peptide: 13.6 pg/mL (ref 0.0–100.0)

## 2020-06-19 LAB — TROPONIN I (HIGH SENSITIVITY)
Troponin I (High Sensitivity): 7 ng/L (ref <18)
Troponin I (High Sensitivity): 6 ng/L (ref <18)

## 2020-06-19 LAB — HEPATIC FUNCTION PANEL
ALT: 90 U/L — ABNORMAL HIGH (ref 0–44)
AST: 57 U/L — ABNORMAL HIGH (ref 15–41)
Albumin: 3 g/dL — ABNORMAL LOW (ref 3.5–5.0)
Alkaline Phosphatase: 777 U/L — ABNORMAL HIGH (ref 38–126)
Bilirubin, Direct: 0.3 mg/dL — ABNORMAL HIGH (ref 0.0–0.2)
Indirect Bilirubin: 0.7 mg/dL (ref 0.3–0.9)
Total Bilirubin: 1 mg/dL (ref 0.3–1.2)
Total Protein: 7.3 g/dL (ref 6.5–8.1)

## 2020-06-19 LAB — URINALYSIS, ROUTINE W REFLEX MICROSCOPIC
Bacteria, UA: NONE SEEN
Bilirubin Urine: NEGATIVE
Glucose, UA: >=500 mg/dL — AB
Hgb urine dipstick: NEGATIVE
Ketones, ur: NEGATIVE mg/dL
Leukocytes,Ua: NEGATIVE
Nitrite: NEGATIVE
Protein, ur: >=300 mg/dL — AB
Specific Gravity, Urine: 1.014 (ref 1.005–1.030)
pH: 6 (ref 5.0–8.0)

## 2020-06-19 LAB — GAMMA GT: GGT: 1829 U/L — ABNORMAL HIGH (ref 7–50)

## 2020-06-19 LAB — HEPATITIS PANEL, ACUTE
HCV Ab: NONREACTIVE
Hep A IgM: NONREACTIVE
Hep B C IgM: NONREACTIVE
Hepatitis B Surface Ag: NONREACTIVE

## 2020-06-19 LAB — CBG MONITORING, ED: Glucose-Capillary: 407 mg/dL — ABNORMAL HIGH (ref 70–99)

## 2020-06-19 LAB — SODIUM, URINE, RANDOM: Sodium, Ur: 71 mmol/L

## 2020-06-19 LAB — HIV ANTIBODY (ROUTINE TESTING W REFLEX): HIV Screen 4th Generation wRfx: NONREACTIVE

## 2020-06-19 LAB — GLUCOSE, CAPILLARY: Glucose-Capillary: 156 mg/dL — ABNORMAL HIGH (ref 70–99)

## 2020-06-19 LAB — HEMOGLOBIN A1C
Hgb A1c MFr Bld: 12.8 % — ABNORMAL HIGH (ref 4.8–5.6)
Mean Plasma Glucose: 320.66 mg/dL

## 2020-06-19 LAB — MAGNESIUM: Magnesium: 2.3 mg/dL (ref 1.7–2.4)

## 2020-06-19 LAB — CREATININE, URINE, RANDOM: Creatinine, Urine: 56.95 mg/dL

## 2020-06-19 LAB — PHOSPHORUS: Phosphorus: 4 mg/dL (ref 2.5–4.6)

## 2020-06-19 LAB — LIPASE, BLOOD: Lipase: 79 U/L — ABNORMAL HIGH (ref 11–51)

## 2020-06-19 LAB — SARS CORONAVIRUS 2 (TAT 6-24 HRS): SARS Coronavirus 2: NEGATIVE

## 2020-06-19 IMAGING — CT CT ABD-PELV W/O CM
2 of 4 series · 16 of 46 positions shown, 18 images · non-contrast
Comparison: None.

CLINICAL DATA: Abdominal pain

EXAM:
CT ABDOMEN AND PELVIS WITHOUT CONTRAST
TECHNIQUE: Multidetector CT imaging of the abdomen and pelvis was performed
following the standard protocol without IV contrast.

[Series 3: ap without · axial · non-contrast · 0.80mm/px · z∈[+717,+1132]mm · 13 of 95 slices shown, 15 images]
[im 6/95  soft-tissue]
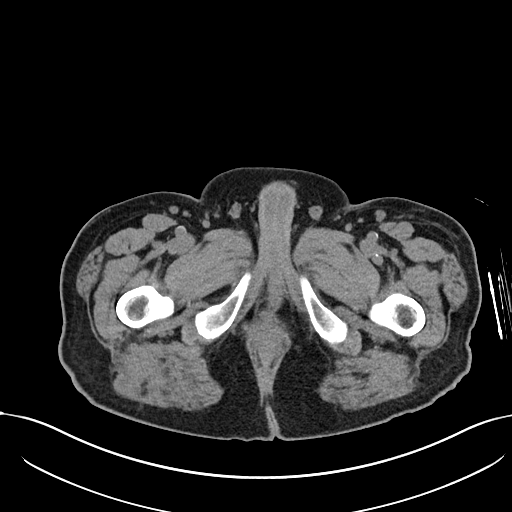
[im 6/95  bone]
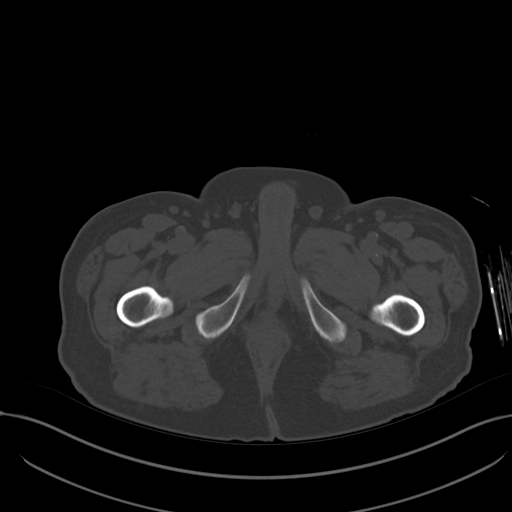
[im 12/95  soft-tissue]
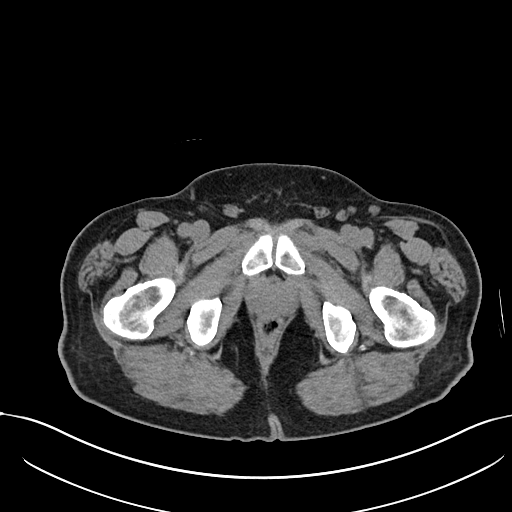
[im 23/95  soft-tissue]
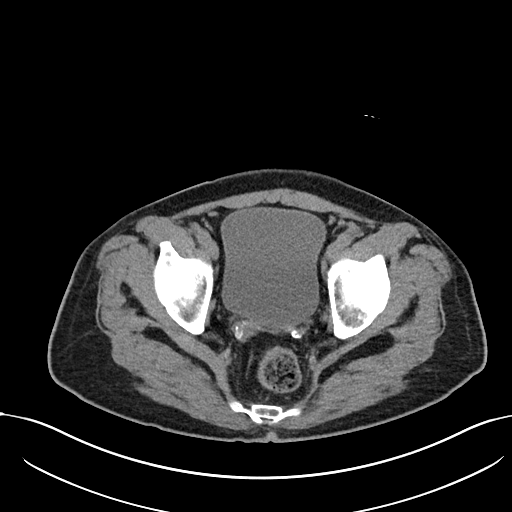
[im 28/95  soft-tissue]
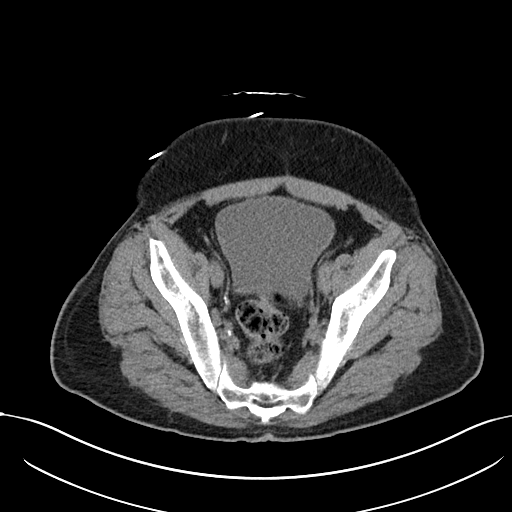
[im 34/95  soft-tissue]
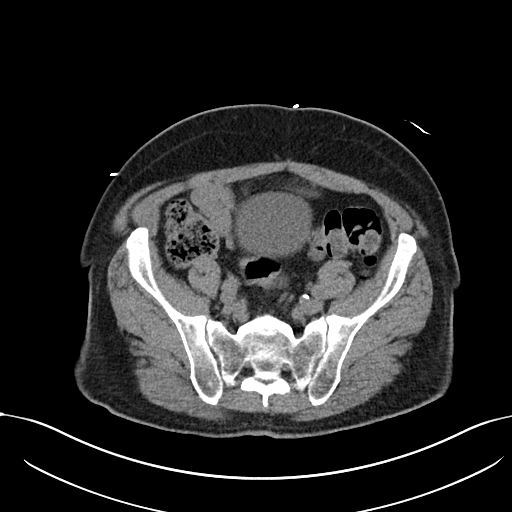
[im 39/95  soft-tissue]
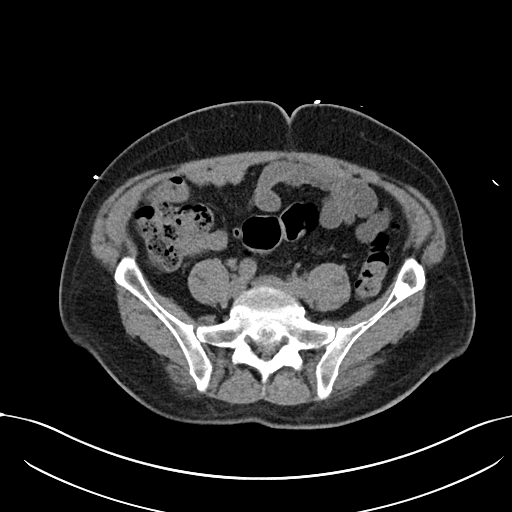
[im 50/95  soft-tissue]
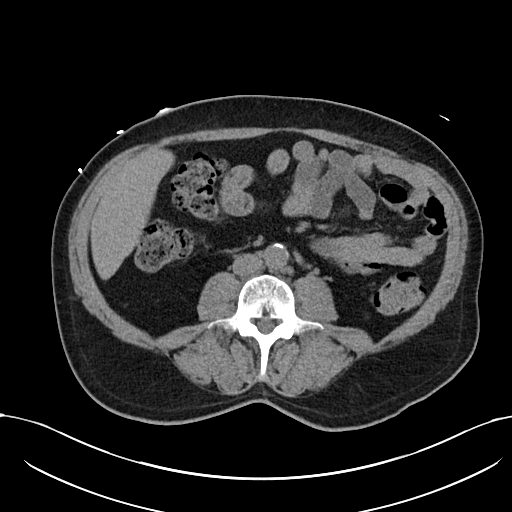
[im 56/95  soft-tissue]
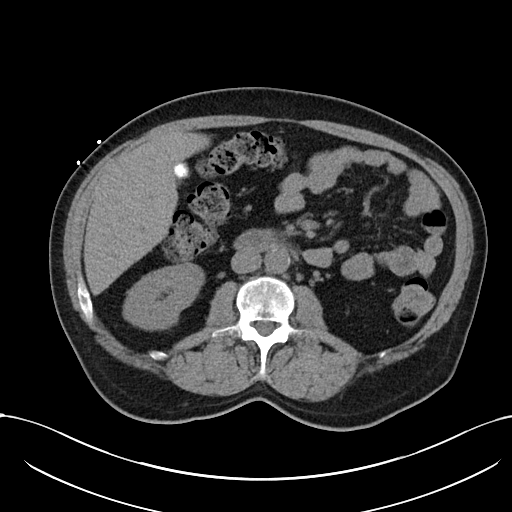
[im 61/95  soft-tissue]
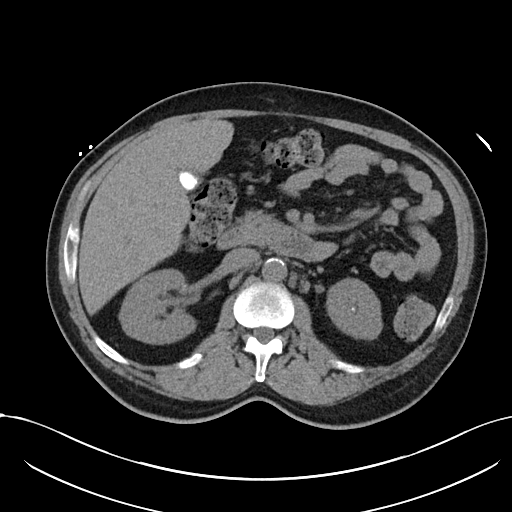
[im 61/95  bone]
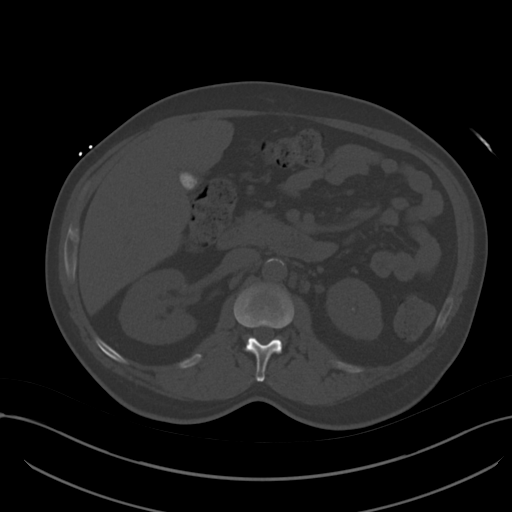
[im 67/95  soft-tissue]
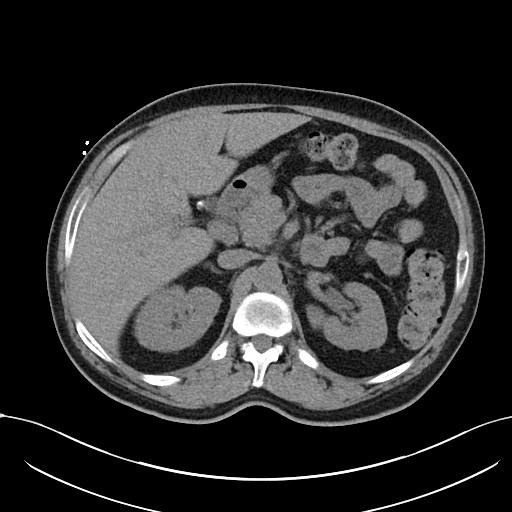
[im 72/95  soft-tissue]
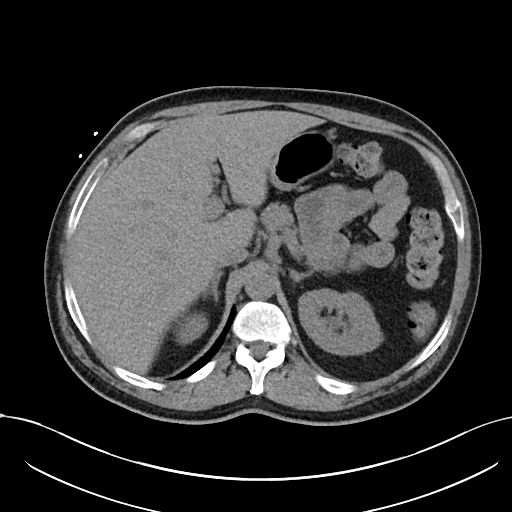
[im 83/95  soft-tissue]
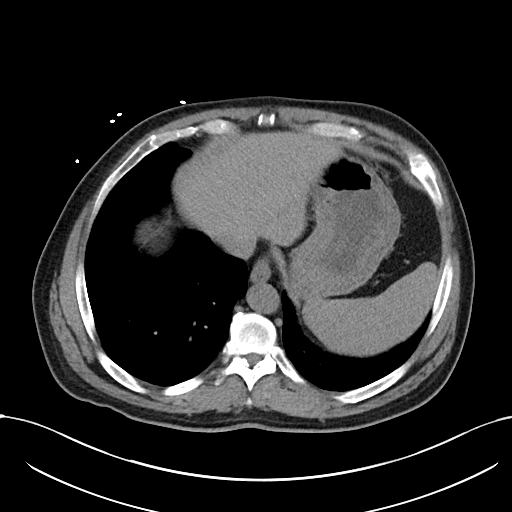
[im 89/95  soft-tissue]
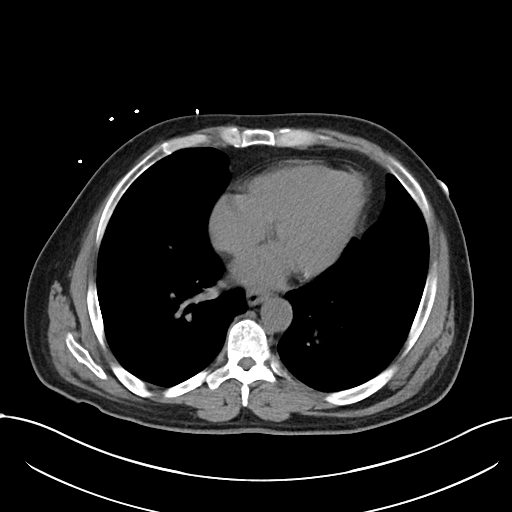

[Series 6: cor · coronal · 0.86mm/px · 3 of 94 slices shown]
[im 32/94  soft-tissue]
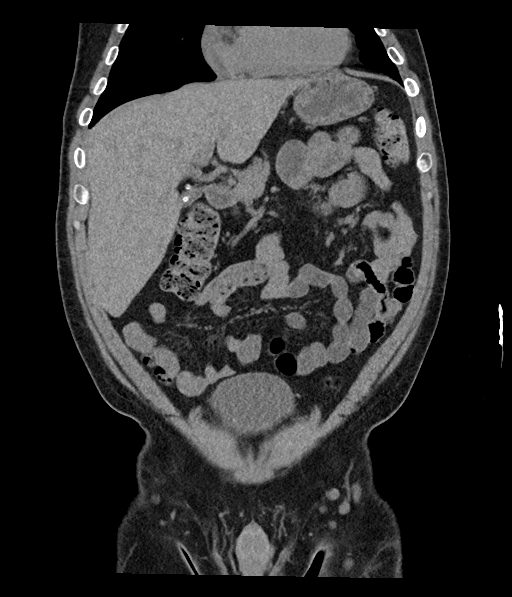
[im 42/94  soft-tissue]
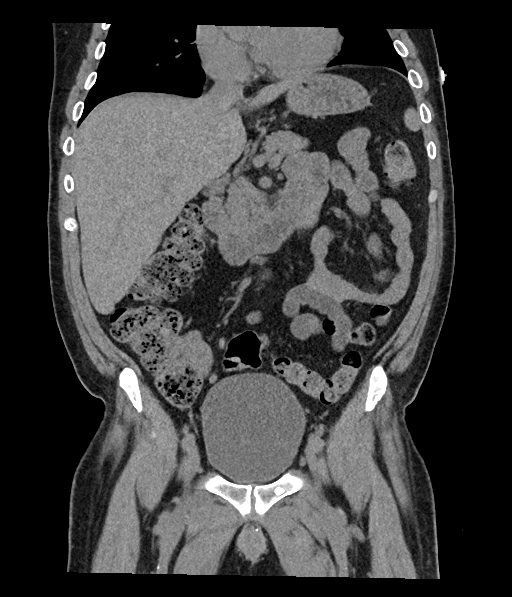
[im 52/94  soft-tissue]
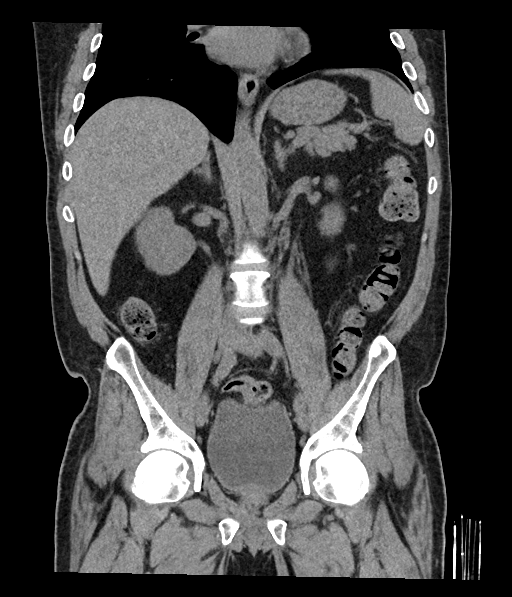

[16 of 46 positions shown; findings below may reference images not displayed]

FINDINGS: Lower chest: No acute abnormality.

Hepatobiliary: Liver is within normal limits. Gallbladder
demonstrates dense calcification within likely related to
contraction and multiple small gallstones. No ductal dilatation is
seen.

Pancreas: Unremarkable. No pancreatic ductal dilatation or
surrounding inflammatory changes.

Spleen: Normal in size without focal abnormality.

Adrenals/Urinary Tract: Adrenal glands are within normal limits
bilaterally. Kidneys show a normal appearance with the exception of
small nonobstructing lower pole left renal stone. Ureters are within
normal limits bilaterally. No ureteral stones or obstructive changes
are noted. The bladder is well distended.

Stomach/Bowel: The appendix is within normal limits. No obstructive
or inflammatory changes of the colon are seen. Small bowel and
stomach appear within normal limits.

Vascular/Lymphatic: Aortic atherosclerosis. No enlarged abdominal or
pelvic lymph nodes.

Reproductive: Prostate is unremarkable.

Other: No abdominal wall hernia or abnormality. No abdominopelvic
ascites.

Musculoskeletal: Sclerotic focus is noted in the left iliac wing
likely related to a bone island.
IMPRESSION: Dense calcification within the gallbladder likely related to
decompression and multiple small stones within. No ductal dilatation
is seen.

Nonobstructing lower pole left renal stone.

Normal-appearing appendix.

## 2020-06-19 IMAGING — CR DG CHEST 2V
2 series · 2 of 2 positions shown · non-contrast
Comparison: None.

CLINICAL DATA: Shortness of breath.  Recent chest pain

EXAM:
CHEST - 2 VIEW

[chest pa]
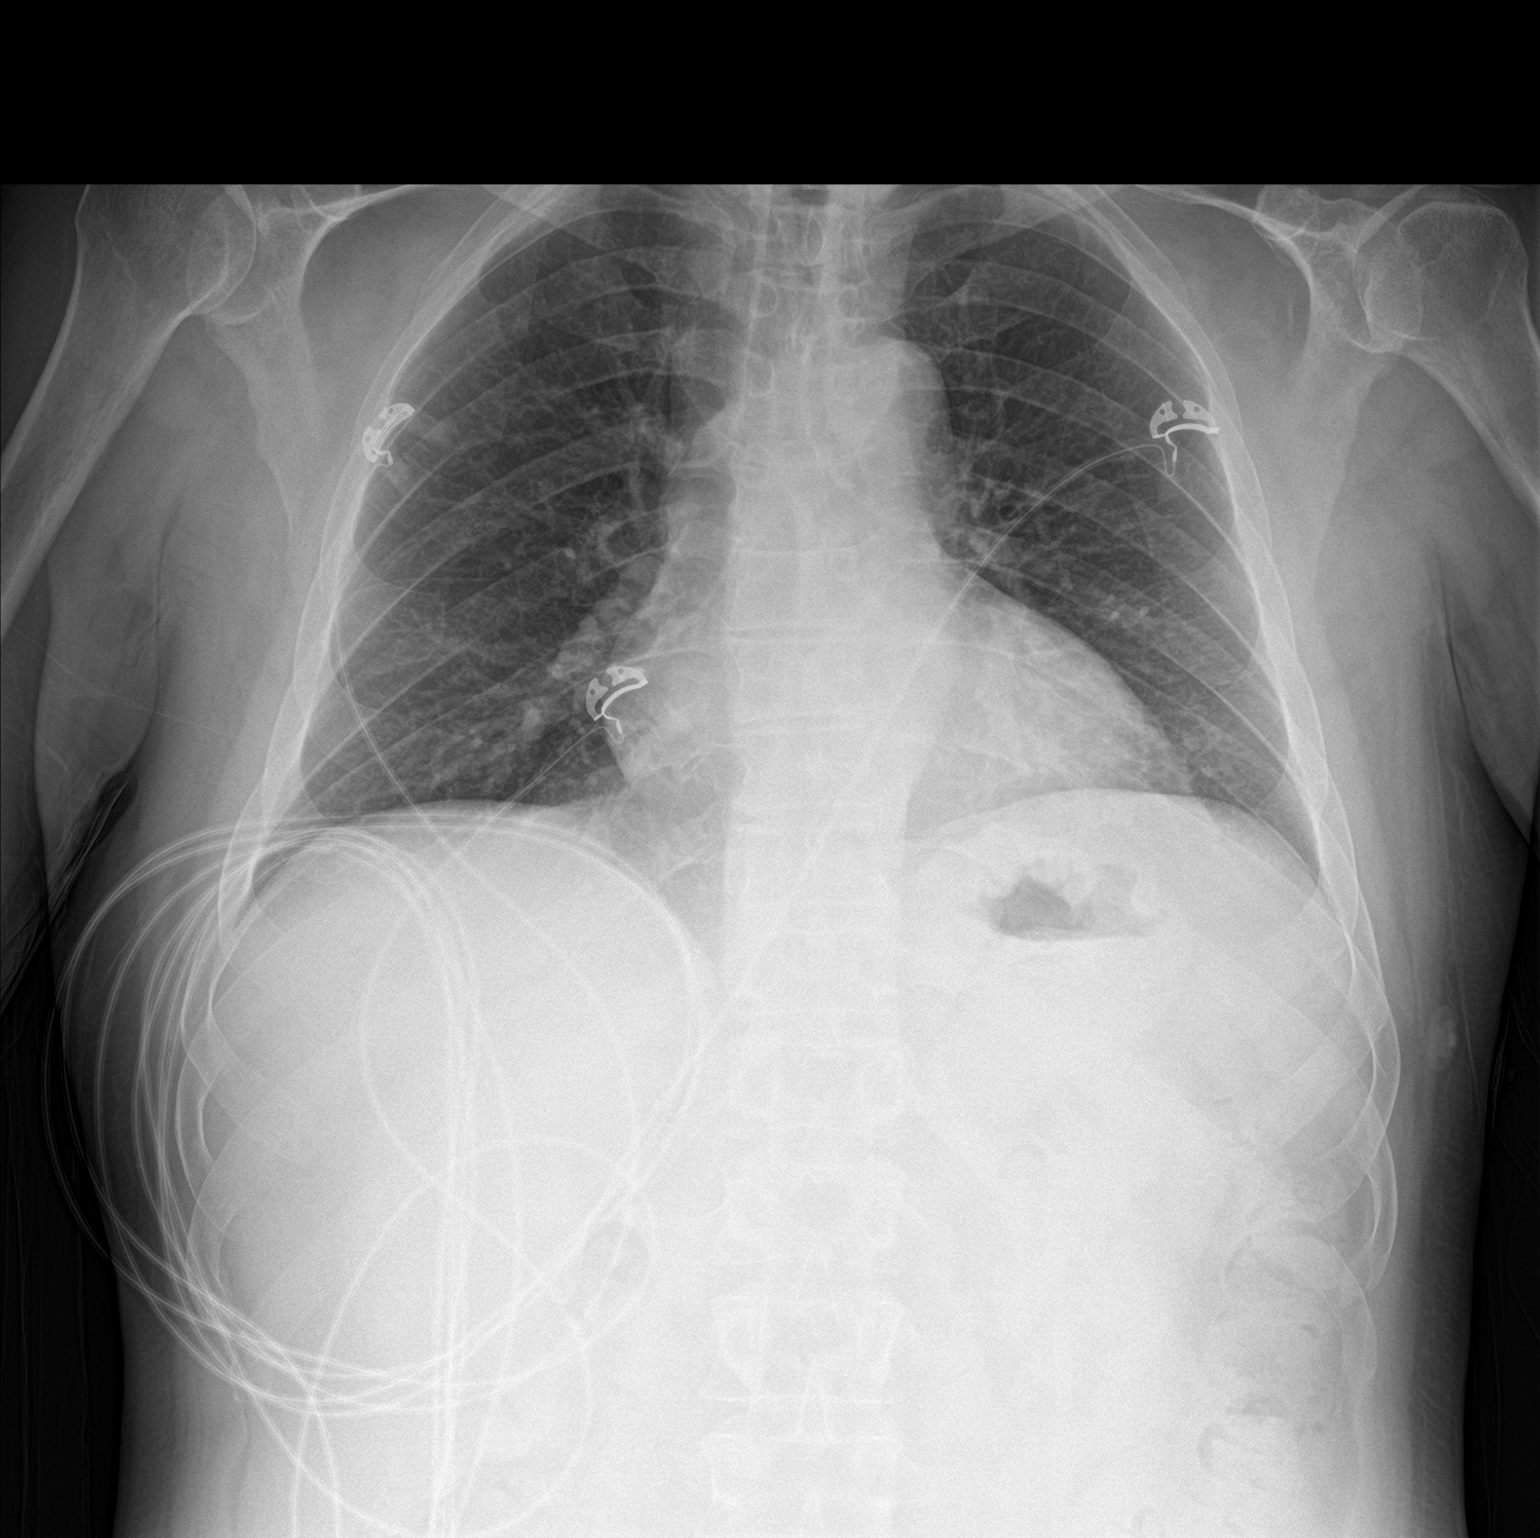

[chest lat]
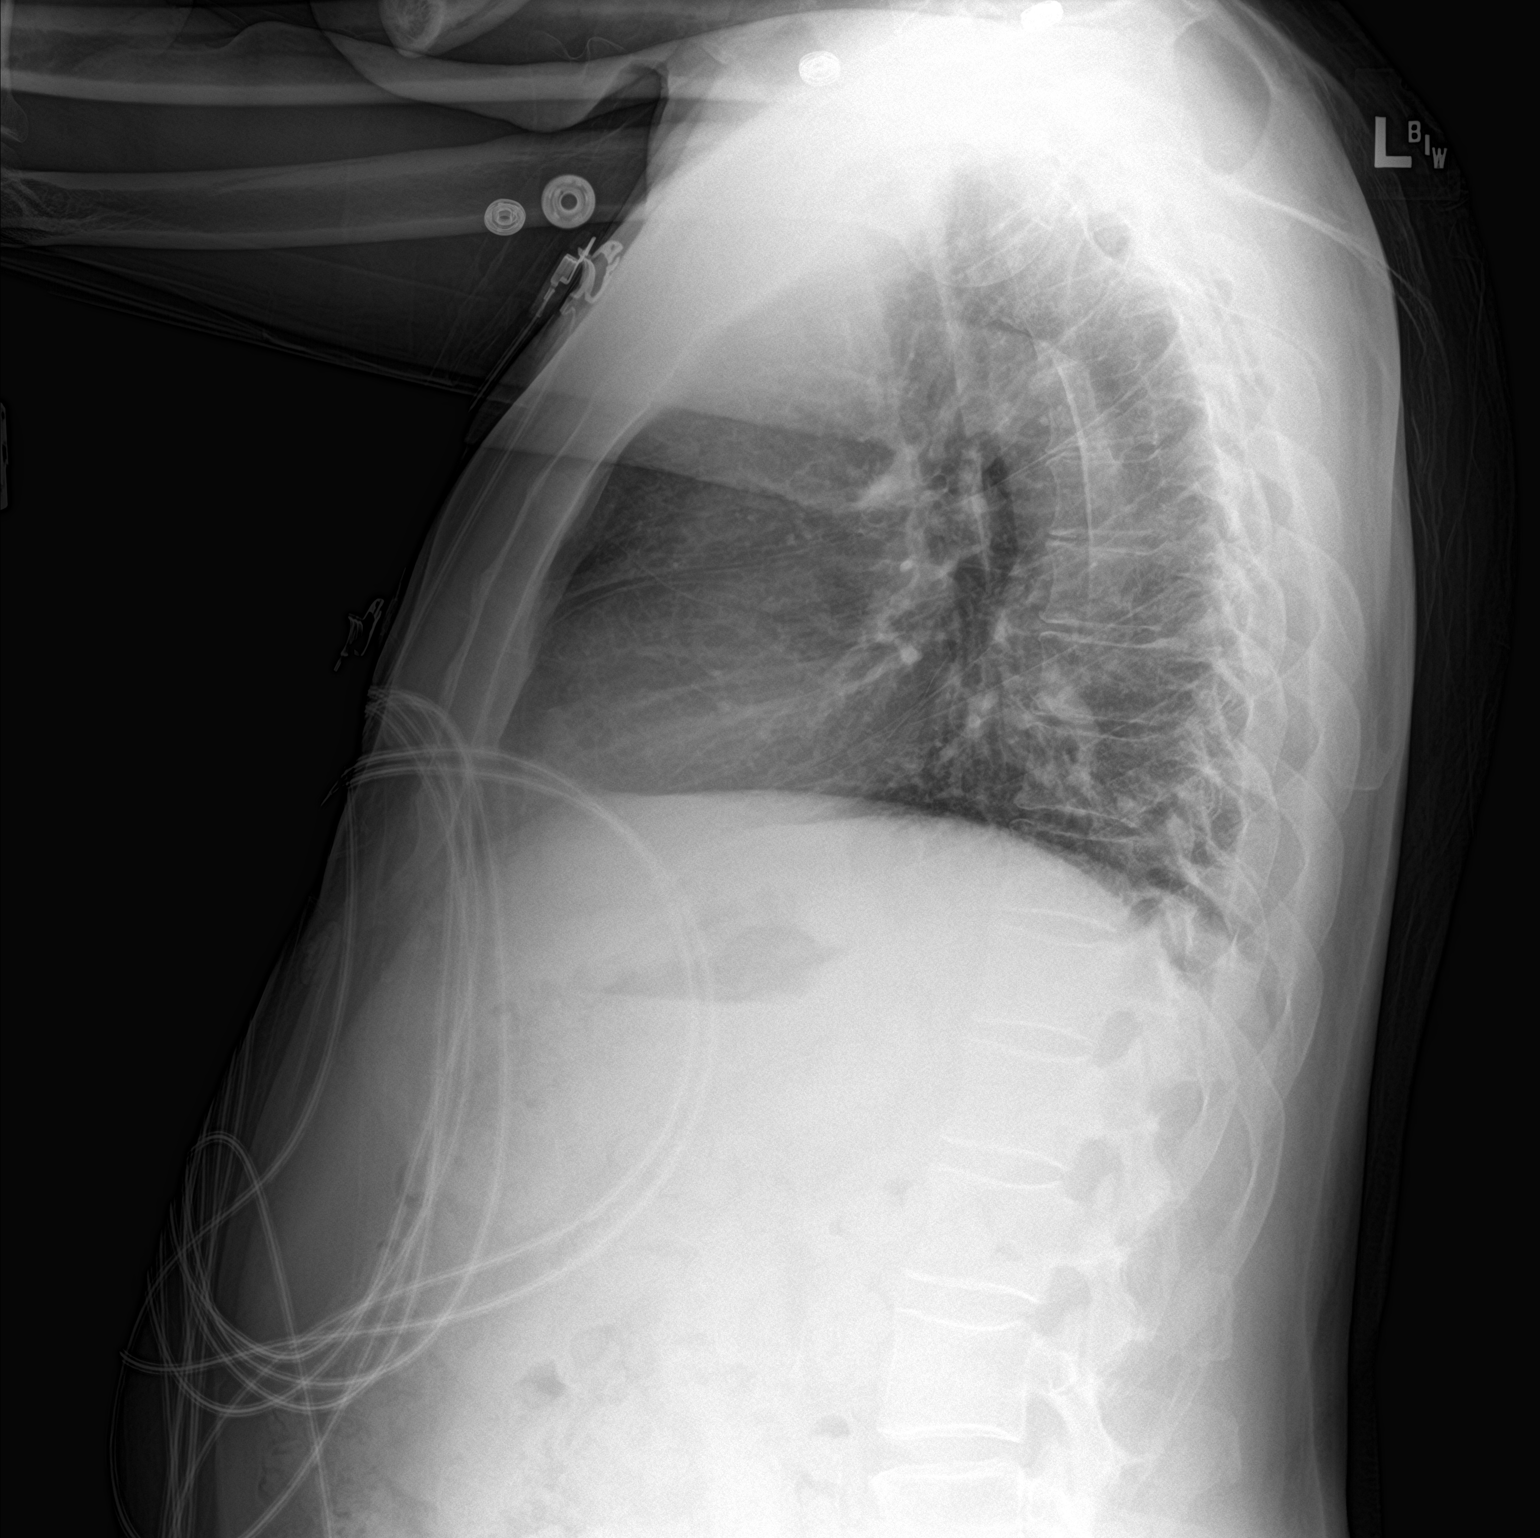

[2 of 2 positions shown; findings below may reference images not displayed]

FINDINGS: Lungs are clear. Heart size and pulmonary vascularity are normal. No
adenopathy. No pneumothorax. No bone lesions.
IMPRESSION: Lungs clear.  Cardiac silhouette normal.

## 2020-06-19 MED ORDER — SODIUM CHLORIDE 0.9 % IV SOLN: INTRAVENOUS | Status: DC

## 2020-06-19 MED ORDER — INSULIN ASPART 100 UNIT/ML ~~LOC~~ SOLN
0.0000 [IU] | Freq: Three times a day (TID) | SUBCUTANEOUS | Status: DC
  Administered 2020-06-19 – 2020-06-20 (×3): 3 [IU] via SUBCUTANEOUS
  Administered 2020-06-20: 2 [IU] via SUBCUTANEOUS
  Administered 2020-06-21: 3 [IU] via SUBCUTANEOUS
  Administered 2020-06-21: 8 [IU] via SUBCUTANEOUS
  Administered 2020-06-21: 2 [IU] via SUBCUTANEOUS
  Administered 2020-06-22: 3 [IU] via SUBCUTANEOUS
  Administered 2020-06-22: 11 [IU] via SUBCUTANEOUS
  Administered 2020-06-22: 2 [IU] via SUBCUTANEOUS
  Administered 2020-06-23: 8 [IU] via SUBCUTANEOUS
  Administered 2020-06-23: 11 [IU] via SUBCUTANEOUS

## 2020-06-19 MED ORDER — INSULIN GLARGINE 100 UNIT/ML ~~LOC~~ SOLN
20.0000 [IU] | Freq: Every day | SUBCUTANEOUS | Status: DC
  Administered 2020-06-19 – 2020-06-21 (×3): 20 [IU] via SUBCUTANEOUS
  Filled 2020-06-19 (×6): qty 0.2

## 2020-06-19 MED ORDER — HYDROXYZINE HCL 25 MG PO TABS: 25.0000 mg | ORAL_TABLET | Freq: Three times a day (TID) | ORAL | Status: DC | PRN

## 2020-06-19 MED ORDER — INSULIN ASPART 100 UNIT/ML ~~LOC~~ SOLN
8.0000 [IU] | Freq: Once | SUBCUTANEOUS | Status: AC
  Administered 2020-06-19: 8 [IU] via SUBCUTANEOUS

## 2020-06-19 MED ORDER — PANTOPRAZOLE SODIUM 20 MG PO TBEC
20.0000 mg | DELAYED_RELEASE_TABLET | Freq: Every day | ORAL | Status: DC
  Administered 2020-06-19 – 2020-06-22 (×4): 20 mg via ORAL
  Filled 2020-06-19 (×5): qty 1

## 2020-06-19 MED ORDER — AMLODIPINE BESYLATE 10 MG PO TABS
10.0000 mg | ORAL_TABLET | Freq: Every day | ORAL | Status: DC
  Administered 2020-06-20 – 2020-06-23 (×4): 10 mg via ORAL
  Filled 2020-06-19 (×4): qty 1

## 2020-06-19 MED ORDER — CHOLESTYRAMINE 4 G PO PACK
4.0000 g | PACK | Freq: Every day | ORAL | Status: DC
  Administered 2020-06-19 – 2020-06-20 (×2): 4 g via ORAL
  Filled 2020-06-19 (×3): qty 1

## 2020-06-19 MED ORDER — ACETAMINOPHEN 325 MG PO TABS: 650.0000 mg | ORAL_TABLET | Freq: Four times a day (QID) | ORAL | Status: DC | PRN

## 2020-06-19 MED ORDER — ALBUTEROL SULFATE HFA 108 (90 BASE) MCG/ACT IN AERS
6.0000 | INHALATION_SPRAY | Freq: Once | RESPIRATORY_TRACT | Status: AC
  Administered 2020-06-19: 6 via RESPIRATORY_TRACT
  Filled 2020-06-19: qty 6.7

## 2020-06-19 MED ORDER — ENOXAPARIN SODIUM 40 MG/0.4ML ~~LOC~~ SOLN
40.0000 mg | SUBCUTANEOUS | Status: DC
  Administered 2020-06-19 – 2020-06-22 (×4): 40 mg via SUBCUTANEOUS
  Filled 2020-06-19 (×4): qty 0.4

## 2020-06-19 MED ORDER — ACETAMINOPHEN 650 MG RE SUPP: 650.0000 mg | Freq: Four times a day (QID) | RECTAL | Status: DC | PRN

## 2020-06-19 MED ORDER — SODIUM CHLORIDE 0.9 % IV BOLUS
1000.0000 mL | Freq: Once | INTRAVENOUS | Status: AC
  Administered 2020-06-19: 1000 mL via INTRAVENOUS

## 2020-06-19 MED ORDER — SODIUM ZIRCONIUM CYCLOSILICATE 10 G PO PACK
10.0000 g | PACK | Freq: Once | ORAL | Status: AC
  Administered 2020-06-19: 10 g via ORAL
  Filled 2020-06-19: qty 1

## 2020-06-19 MED ORDER — NITROGLYCERIN 0.4 MG SL SUBL: 0.4000 mg | SUBLINGUAL_TABLET | SUBLINGUAL | Status: DC | PRN

## 2020-06-19 MED ORDER — CAMPHOR-MENTHOL 0.5-0.5 % EX LOTN
TOPICAL_LOTION | CUTANEOUS | Status: DC | PRN
  Administered 2020-06-19: 1 via TOPICAL
  Filled 2020-06-19: qty 222

## 2020-06-19 NOTE — Progress Notes (Signed)
Inpatient Diabetes Program Recommendations  AACE/ADA: New Consensus Statement on Inpatient Glycemic Control (2015)  Target Ranges:  Prepandial:   less than 140 mg/dL      Peak postprandial:   less than 180 mg/dL (1-2 hours)      Critically ill patients:  140 - 180 mg/dL   Lab Results  Component Value Date   GLUCAP 407 (H) 06/19/2020    Review of Glycemic Control  Diabetes history: DM2 Outpatient Diabetes medications: Lantus 40 Daily, Jardiance 25 mg Daily Current orders for Inpatient glycemic control:  Being evaluated in ED  Inpatient Diabetes Program Recommendations:    -   Lantus 30 units -   Novolog 0-9 units tid + hs  Thanks,  Christena Deem RN, MSN, BC-ADM Inpatient Diabetes Coordinator Team Pager [phone] (8a-5p)

## 2020-06-19 NOTE — Progress Notes (Signed)
Cardiology Office Note:    Date:  06/19/2020   ID:  Joshua Nash, DOB 1966-02-23, MRN [id number]  PCP:  Charlott Rakes, MD  Cardiologist:  Garwin Brothers, MD   Referring MD: Charlott Rakes, MD    ASSESSMENT:    1. Angina pectoris (HCC)   2. Cardiac mass   3. Diabetes mellitus without complication (HCC)   4. Dyspnea on exertion   5. Renal insufficiency    PLAN:    In order of problems listed above:  1. Angina pectoris: Dyspnea on exertion: The patient symptoms are significant.  He has no significant issues at rest.  However he is a young gentleman.  Anything he tries to do he has shortness of breath.  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  He also has significant symptoms of angina.  In addition there are multiple issues such as anemia, history of elevated LFTs, renal insufficiency.  In view of this I discussed with him admission to the hospital because he has multiple acute issues.  He is agreeable.  He needs to proceed to the emergency room for evaluation.  He prefers to go to [facility].  I told him that I will arrange for an ambulance but he is not keen on it.  He promises family member to drive him.  I respect his wishes.  Again he is in stable condition in the office visit and he left the office in a stable condition also.  Nitroglycerin use was explained. 2. Diabetes mellitus: Managed by primary care physician diet was emphasized. 3. Anemia: Significant and will need an evaluation. 4. Renal insufficiency: This needs to be evaluated and if he needs any cardiac evaluation his renal function will have to be monitored closely. 5. Patient has multiple issues they appear to be subacute but nevertheless need fairly prompt attention and therefore he has been sent to the emergency room.  Patient had multiple questions and they were answered through the interpreter.   Medication Adjustments/Labs and Tests Ordered: Current medicines are reviewed at length  with the patient today.  Concerns regarding medicines are outlined above.  No orders of the defined types were placed in this encounter.  No orders of the defined types were placed in this encounter.    No chief complaint on file.    History of Present Illness:    Joshua Nash is a 55 y.o. male.  Patient is past medical history of diabetes mellitus, essential hypertension.  He is here for follow-up.  He has significant dyspnea on exertion.  He tells me that he had an episode of chest tightness on the weekend which was resolved with 2 nitroglycerin.  No orthopnea or PND.  At the time of my evaluation is alert awake oriented and in no distress.  He tells me that he feels fine at rest.  I reviewed his lab work and his hemoglobin is 8.5 and he has renal insufficiency also.  He tells me that he wants to be helped.  He has an appointment for CT coronary angiography with FFR in the month of May.  Also there was an issue of cardiac mass and an MRI was considered.  Past Medical History:  Diagnosis Date  . Abnormal electrocardiogram (ECG) (EKG)   . Angina pectoris (HCC) 06/01/2020  . BMI 26.0-26.9,adult   . BMI 27.0-27.9,adult   . Cardiac mass 06/01/2020  . COVID-19 03/18/2020  . Diabetes mellitus without complication (HCC)   . Elevated liver function tests   .  Hypertension   . Post-COVID chronic fatigue   . Screening for depression   . Tachycardia   . Type 2 diabetes mellitus with proteinuria (HCC)   . Type 2 diabetes mellitus with proteinuria [facility])     Past Surgical History:  Procedure Laterality Date  . LEG SURGERY  02/2020    Current Medications: Current Meds  Medication Sig  . amLODipine (NORVASC) 10 MG tablet Take 1 tablet by mouth daily.  . empagliflozin (JARDIANCE) 25 MG TABS tablet Take 25 mg by mouth daily.  . hydrOXYzine (ATARAX/VISTARIL) 25 MG tablet Take 25 mg by mouth 3 (three) times daily as needed for itching.  Marland Kitchen LANTUS SOLOSTAR 100 UNIT/ML Solostar Pen Inject 40  Units into the skin daily.  Marland Kitchen lisinopril (ZESTRIL) 10 MG tablet Take 10 mg by mouth daily.  . nitroGLYCERIN (NITROSTAT) 0.4 MG SL tablet Place 0.4 mg under the tongue every 5 (five) minutes as needed for chest pain.     Allergies:   Patient has no known allergies.   Social History   Socioeconomic History  . Marital status: Single    Spouse name: Not on file  . Number of children: Not on file  . Years of education: Not on file  . Highest education level: Not on file  Occupational History  . Not on file  Tobacco Use  . Smoking status: Never Smoker  . Smokeless tobacco: Never Used  Substance and Sexual Activity  . Alcohol use: Yes    Comment: occ  . Drug use: Never  . Sexual activity: Not on file  Other Topics Concern  . Not on file  Social History Narrative  . Not on file   Social Determinants of Health   Financial Resource Strain: Not on file  Food Insecurity: Not on file  Transportation Needs: Not on file  Physical Activity: Not on file  Stress: Not on file  Social Connections: Not on file     Family History: The patient's family history includes Diabetes in his father and mother; Hyperlipidemia in his father and mother; Hypertension in his father and mother.  ROS:   Please see the history of present illness.    All other systems reviewed and are negative.  EKGs/Labs/Other Studies Reviewed:    The following studies were reviewed today: I discussed my findings with the patient at length.   Recent Labs: No results found for requested labs within last 8760 hours.  Recent Lipid Panel No results found for: CHOL, TRIG, HDL, CHOLHDL, VLDL, LDLCALC, LDLDIRECT  Physical Exam:    VS:  BP (!) 142/82   Pulse 100   Ht 5\' 5"  (1.651 m)   Wt 162 lb 6.4 oz (73.7 kg)   SpO2 99%   BMI 27.02 kg/m     Wt Readings from Last 3 Encounters:  06/19/20 162 lb 6.4 oz (73.7 kg)  06/01/20 167 lb 9.6 oz (76 kg)  05/19/20 167 lb (75.8 kg)     GEN: Patient is in no acute  distress HEENT: Normal NECK: No JVD; No carotid bruits LYMPHATICS: No lymphadenopathy CARDIAC: Hear sounds regular, 2/6 systolic murmur at the apex. RESPIRATORY:  Clear to auscultation without rales, wheezing or rhonchi  ABDOMEN: Soft, non-tender, non-distended MUSCULOSKELETAL:  No edema; No deformity  SKIN: Warm and dry NEUROLOGIC:  Alert and oriented x 3 PSYCHIATRIC:  Normal affect   Signed, 07/19/20, MD  06/19/2020 8:28 AM    [facility]

## 2020-06-19 NOTE — ED Notes (Signed)
Patient transported to CT 

## 2020-06-19 NOTE — ED Notes (Signed)
Patient transported to X-ray 

## 2020-06-19 NOTE — H&P (Addendum)
Family Medicine Teaching [facility] Admission History and Physical Service Pager: [phone]  Patient name: Joshua Nash Medical record number: [id number] Date of birth: May 23, 1965 Age: 55 y.o. Gender: male  Primary Care Provider: Charlott Rakes, MD Consultants: Cardiology  Code Status: Full Code   Emergency Contact:Miranda Phebe Colla, [phone]  Chief Complaint: chest pain, tiredness, and blood sugar   Assessment and Plan: Joshua Nash is a 55 y.o. male presenting with chest pain, fatigue and hyperglycemia sent to the ED from cardiologist office for abnormal labs. PMH is significant for T2DM, HTN, transaminitis, CKD, post-Covid chronic fatigue, cardiac mass  Angina pectoris, chronic, worsening Patient reports intermittent, exertional chest pain associated with shortness of breath for the past 3 months which is relieved by rest and nitroglycerin currently followed by cardiology (Dr. Tomie China) and was seen in his office earlier today, sent over to the ED due to multiple abnormal labs. He is reporting occasional chest pain at rest though does not appear to occur often.  In the ED, he an EKG which revealed borderline peaked T waves but no ST abnormalities and troponins normal and flat. Low suspicion for ACS at this time, suspect stable angina based on symptomology.  Will admit patient for monitoring and treatment of multiple lab abnormalities.  Will consult cardiology to see if any further work-up is recommended while admitted. - admit to med-tele, Dr. McDiarmid attending - trial pantoprazole 20 mg daily (did describe burning pain) - AM labs: CMP, CBC, lipid panel - cardiology consulted, appreciate recommendations and care of this patient  - vitals per floor protocol  - PRN Tylenol for pain  - SL nitroglycerin PRN   Hyperkalemia K elevated to 6.4 on admission. EKG with borderline peaked T waves.  He was treated in the ED with IV fluids, insulin, beta agonist, and  Lokelma. - re-check BMP at 1800 - check Mg, Phos  AKI superimposed on CKD stage IIIa Patient has had elevated creatinine with progressively elevation in the last 2 months. On admission Cr is 1.9 and was noted to be 1.6 two months ago. Baseline appears to be 1.5-1.7 though most records are not in our system, estimate he has CKD stage IIIa based creatinine values in our system. He denies seeing nephrology as outpatient but reports that he has been told he has renal disease. He reports renal issues for his parents, grandfather, aunts and uncles all 2/2 to blood sugar.   - mIVF NS - urine sodium and creatinine; calculate FeNa  - hold home lisinopril, avoid nephrotoxic agents  - monitor Cr with daily metabolic panel   W9NL Patient with hyperglycemia and glucose elevated 516. Home medications include jardiance and lantus 40 units daily. Patient denies having home lantus today prior to arriving in ED.  - moderate SSI  - lantus 20 units, titrate as appropriate to home dose   - CBG 4x daily with meals and AM fasting - hold empagliflozin  HTN On lisinopril 10 mg and amlodipine 10 mg at home.  Mildly hypertensive with systolics in the 140s to 150s so far. - continue home amlodipine - hold lisinopril given AKI  Transaminitis Appears to be cholestatic with significant elevated ALP (777) with mild elevation in AST and ALT (57 and 90, respectively).  Of note, he has had significant elevations in ALP in the past (in the 500s as of a few months ago).  Total bilirubin within normal limits.  Lipase mildly elevated at 79.  CT abdomen revealed gallstones without bile duct dilation.  Could  consider gallstone pancreatitis though less likely without bile duct dilation and lipase only mildly elevated.  No leukocytosis to suggest acute cholecystitis.  RUQ US ordered in the ED, will follow up on results. 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  Discussion was had with patient regarding TEE versus cardiac MRI, patient had opted for cardiac MRI which is currently scheduled for 07/10/2020.  Cardiology consulted to see if any further work-up recommended during this admission. - f/u cardiology recommendations  Normocytic anemia Mild, Hgb 12.5 on admission. He has not had a screening colonoscopy. Denies hematuria, melena or hematochezia.  - recommend screening colonoscopy on f/u  Pruritis He has had pruritus for over a month, not relieved by topical steroid, diphenhydramine, or hydroxyzine.  He does have some hyperpigmented papular lesions on his back, neck, and chest unclear if related to scratching.  No significant elevation in bilirubin to explain pruritus. - hydroxyzine 25 mg TID prn - sarna topical  - cholestyramine   FEN/GI: carb modified diet   Prophylaxis: lovenox   Disposition: medical telemetry   History of Present Illness:  [name] is a 55 y.o. male presenting with SOB without much activity. He reports that this has been going on for more than 3 months. He reports that he had an appointment with a cardiologist who recommended that he come into the ED.  Patient feels that his chest is "compressed" when he takes showers. If he tries to wash dishes or walking, he feels as if he is running a marathon. He reports that the pain improves with rest. He has to sit down for a while and the chest discomfort will calm down. He also reports feeling off balance at times with standing.   He reports epigastric abdominal pain after initial abdominal exam in the ED. He also reports nausea, pain after eating, without eating he sometimes has bloating. He denies emesis. He reports trouble breathing when he has the chest pains. He also reports sweating associated with the chest pain and while eating. Two nights ago, he had to take the nitroglycerin for his chest pain for 1  dose and it relieved the chest pain. He sometimes has chest pain that wakes him from his sleep, two days ago was the last time. Patient reports 5-6lbs  weight loss due to not having an appetite. Weight loss occurred in the last month.    Patient reports he takes two medications for BP 25mg , lantus 40 units, one pill for blood glucose as well as nitroglycerin as needed. He reports that his fasting blood glucoses are usually 142-135 in the mornings.   Patient reports that he had his jardiance and lisinopril.   Social Hx  Smk - patient denies smoking history, never smoker  Alcohol - denies alchohol consumption  Recreational Drug Use - denies recreational drug use  Patient lives with spouse and two children    ED Course:  In the ED, VSS with mild hypertension to 140s systolic.  Noted to have lower abdominal tenderness, CT abdomen/pelvis without contrast was obtained given elevated creatinine from baseline, CT was overall unremarkable other than gallstones and a nonobstructing kidney stone.  Lipase was marginally elevated.  Noted to be hyperkalemic with a potassium of 6.4 with mildly peaked T waves on EKG, so he was given IV fluids, albuterol, insulin, and Lokelma.  CXR obtained unremarkable.  Review Of Systems: Per HPI with the following additions:   Review of Systems  Constitutional: Positive for diaphoresis, malaise/fatigue and weight loss. Negative for chills and fever.       Decreased appetite  Respiratory: Positive for shortness of breath. Negative for cough and sputum production.   Cardiovascular: Positive for chest pain. Negative for leg swelling.  Gastrointestinal: Positive for abdominal pain and nausea. Negative for vomiting.       Epigastric abdominal pain  Genitourinary: Positive for flank pain and frequency. Negative for dysuria and hematuria.    Patient Active Problem List   Diagnosis Date Noted   Dyspnea on exertion 06/19/2020   Renal insufficiency 06/19/2020   Angina  pectoris (HCC) 06/01/2020   Cardiac mass 06/01/2020   Diabetes mellitus without complication (HCC)    Type 2 diabetes mellitus with proteinuria (HCC)    Tachycardia    Abnormal electrocardiogram (ECG) (EKG)    BMI 27.0-27.9,adult    Elevated liver function tests    Hypertension    Post-COVID chronic fatigue    Screening for depression    COVID-19 03/18/2020    Past Medical History: Past Medical History:  Diagnosis Date   Abnormal electrocardiogram (ECG) (EKG)    Angina pectoris (HCC) 06/01/2020   BMI 26.0-26.9,adult    BMI 27.0-27.9,adult    Cardiac mass 06/01/2020   COVID-19 03/18/2020   Diabetes mellitus without complication (HCC)    Elevated liver function tests    Hypertension    Post-COVID chronic fatigue    Screening for depression    Tachycardia    Type 2 diabetes mellitus with proteinuria (HCC)    Type 2 diabetes mellitus with proteinuria [facility])     Past Surgical History: Past Surgical History:  Procedure Laterality Date   LEG SURGERY  02/2020    Social History: Social History   Tobacco Use   Smoking status: Never Smoker   Smokeless tobacco: Never Used  Substance Use Topics   Alcohol use: Yes    Comment: occ   Drug use: Never   Additional social history:  Smk - patient denies smoking history, never smoker  Alcohol - denies alchohol consumption  Recreational Drug Use - denies recreational drug use   Lives with spouse and two children   Please also refer to relevant sections of EMR.  Family History: Family History  Problem Relation Age of Onset   Diabetes Mother    Hyperlipidemia Mother    Hypertension Mother    Diabetes Father    Hyperlipidemia Father    Hypertension Father     Allergies and Medications: No Known Allergies No current facility-administered medications on file prior to encounter.   Current Outpatient Medications on File Prior to Encounter  Medication Sig Dispense Refill   amLODipine (NORVASC) 10 MG tablet Take 1 tablet by  mouth daily.     empagliflozin (JARDIANCE) 25 MG TABS tablet Take 25 mg by mouth daily.     hydrOXYzine (ATARAX/VISTARIL) 25 MG tablet Take 25 mg by mouth 3 (three) times daily as needed for itching.     LANTUS SOLOSTAR 100 UNIT/ML Solostar Pen Inject 40 Units into the skin daily.     lisinopril (ZESTRIL) 10 MG tablet Take 10 mg by mouth daily.     nitroGLYCERIN (NITROSTAT) 0.4 MG SL tablet Place 0.4 mg under the tongue every 5 (five) minutes as needed for chest pain.      Objective: BP (!) 147/96   Pulse 91   Temp 98.1  F (36.7 C) (Oral)   Resp 14   SpO2 99%   Exam: General: Overweight male laying in bed comfortably, NAD Eyes: PERRL, EOMI, pterygium noted in left eye ENTM: [place]/AT, MMM Neck: Supple, no LAD Cardiovascular: RRR, no murmur Respiratory: CTAB, no respiratory distress Gastrointestinal: Soft, mild tenderness to epigastric and RUQ, positive Murphy sign MSK: No deformity Derm: Scattered, hyperpigmented papular rash on back, neck, and chest possibly from excoriation vs eczema  Neuro: Alert, interactive Psych: Affect appropriate, tearful at end of encounter expressing worry  Labs and Imaging: CBC BMET  Recent Labs  Lab 06/19/20 1001  WBC 8.5  HGB 12.5*  HCT 40.4  PLT 335   Recent Labs  Lab 06/19/20 1001  NA 129*  K 6.4*  CL 100  CO2 21*  BUN 29*  CREATININE 1.90*  GLUCOSE 516*  CALCIUM 9.5      Littie Deeds, MD 06/19/2020, 2:13 PM PGY-1, [facility] FPTS Intern pager: [phone], text pages welcome    FPTS Upper-Level Resident Addendum   I have independently interviewed and examined the patient. I have discussed the above with Dr. Wynelle Link and agree with the above documentation. My edits for correction/addition/clarification are included above. Please see any attending notes.   Ronnald Ramp, MD PGY-2, [facility] 06/19/2020 5:49 PM  FPTS Service pager: [phone] (text pages welcome through [facility])

## 2020-06-19 NOTE — ED Triage Notes (Signed)
Pt advised by cardiologist to come to ED for admission to hospital. Pt reports shortness of breath with exertion x several months, has multiple abnormal lab values, and had chest pain on Saturday but none today. Reports an "energy" all over his body.

## 2020-06-19 NOTE — Consult Note (Addendum)
Cardiology Consultation:   Patient ID: Joshua Nash MRN: [id number]; DOB: 1965-06-24  Admit date: 06/19/2020 Date of Consult: 06/19/2020  PCP:  Charlott Rakes, MD   [facility]  Cardiologist:  Garwin Brothers, MD   Patient Profile:   Joshua Nash is a 55 y.o. male with a hx of hypertension, diabetes mellitus, cardiac mass, obesity, left groin abscess s/p I&D at [facility] 03/19/2020 who is being seen today for the evaluation of chest pain and shortness of breath at the request of Joshua Nash.   Establish care with Dr. Tomie China June 01, 2020 for dyspnea on exertion.  Also has some exertional chest tightness.  His symptoms worse concerning for angina pectoris.  Order CT coronary.  Per review of note, seems patient was admitted to [facility] for same reason.  LFT was elevated.  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  He has pending cardiac MRI.  History of Present Illness:   Joshua Nash was seen by Dr. Tomie China this morning for ongoing chest pain and shortness of breath.  Given concerning symptoms he was sent to emergency room for further evaluation.  Patient was found to have hyperglycemia, hyperkalemia, AKI, elevated lipase and LFTs.  He was transferred to [facility] for admission.  Patient reports 2 to 3 months history of progressive worsening dyspnea on exertion and chest tightness.  Associated with nausea.  His symptoms improves at rest but takes while to get better.  Also has abdominal pain.  Reports constipation.  No fever or diarrhea.  Denies syncope or lower extremity edema.  He has loss of appetite and fatigue.  Denies tobacco smoking, illicit drug use or alcohol abuse.   Father died of heart issue at age 47.  Reports other family member has heart issue as well.  Interpreter service used to get all above informations.  Hs-troponin 7>>6 K 6.4 SCr 1.9 Glucose 516 Sodium 129 Lipase 79 AST/ALT 57/90 Hgb  12.5 COVID negative Normal chest X-ray   Non contrast CT of abdomen  IMPRESSION: Dense calcification within the gallbladder likely related to decompression and multiple small stones within. No ductal dilatation is seen.  Nonobstructing lower pole left renal stone.  Normal-appearing appendix.  Past Medical History:  Diagnosis Date  . Abnormal electrocardiogram (ECG) (EKG)   . Angina pectoris (HCC) 06/01/2020  . BMI 26.0-26.9,adult   . BMI 27.0-27.9,adult   . Cardiac mass 06/01/2020  . COVID-19 03/18/2020  . Diabetes mellitus without complication (HCC)   . Elevated liver function tests   . Hypertension   . Post-COVID chronic fatigue   . Screening for depression   . Tachycardia   . Type 2 diabetes mellitus with proteinuria (HCC)   . Type 2 diabetes mellitus with proteinuria [facility])     Past Surgical History:  Procedure Laterality Date  . LEG SURGERY  02/2020   Inpatient Medications: Scheduled Meds: . [START ON 06/20/2020] amLODipine  10 mg Oral Daily  . enoxaparin (LOVENOX) injection  40 mg Subcutaneous Q24H  . insulin aspart  0-15 Units Subcutaneous TID WC  . insulin glargine  20 Units Subcutaneous QHS  . pantoprazole  20 mg Oral Daily   Continuous Infusions: . sodium chloride 125 mL/hr at 06/19/20 1757   PRN Meds: acetaminophen **OR** acetaminophen, hydrOXYzine, nitroGLYCERIN  Allergies:   No Known Allergies  Social History:   Social History   Socioeconomic History  . Marital status: Single    Spouse name: Not on file  . Number of  children: Not on file  . Years of education: Not on file  . Highest education level: Not on file  Occupational History  . Not on file  Tobacco Use  . Smoking status: Never Smoker  . Smokeless tobacco: Never Used  Substance and Sexual Activity  . Alcohol use: Yes    Comment: occ  . Drug use: Never  . Sexual activity: Not on file  Other Topics Concern  . Not on file  Social History Narrative  . Not on file   Social  Determinants of Health   Financial Resource Strain: Not on file  Food Insecurity: Not on file  Transportation Needs: Not on file  Physical Activity: Not on file  Stress: Not on file  Social Connections: Not on file  Intimate Partner Violence: Not on file    Family History:   Family History  Problem Relation Age of Onset  . Diabetes Mother   . Hyperlipidemia Mother   . Hypertension Mother   . Diabetes Father   . Hyperlipidemia Father   . Hypertension Father      ROS:  Please see the history of present illness.  All other ROS reviewed and negative.     Physical Exam/Data:   Vitals:   06/19/20 1500 06/19/20 1530 06/19/20 1603 06/19/20 1628  BP: (!) 153/99 (!) 156/99  (!) 157/99  Pulse: 92 96  98  Resp: 16 16  19   Temp:   98.4 F (36.9 C) 98.3 F (36.8 C)  TempSrc:   Oral Oral  SpO2: 99% 100%  100%    Intake/Output Summary (Last 24 hours) at 06/19/2020 1827 Last data filed at 06/19/2020 1308 Gross per 24 hour  Intake 1000 ml  Output --  Net 1000 ml   Last 3 Weights 06/19/2020 06/01/2020 05/19/2020  Weight (lbs) 162 lb 6.4 oz 167 lb 9.6 oz 167 lb  Weight (kg) 73.664 kg 76.023 kg 75.751 kg     There is no height or weight on file to calculate BMI.  General:  Well nourished, well developed, in no acute distress HEENT: normal Lymph: no adenopathy Neck: no JVD Endocrine:  No thryomegaly Vascular: No carotid bruits; FA pulses 2+ bilaterally without bruits  Cardiac:  normal S1, S2; RRR; no murmur  Lungs:  clear to auscultation bilaterally, no wheezing, rhonchi or rales  Abd: soft, TTP in all quadrants  Ext: no edema Musculoskeletal:  No deformities, BUE and BLE strength normal and equal Skin: warm and dry  Neuro:  CNs 2-12 intact, no focal abnormalities noted Psych:  Normal affect   EKG:  The EKG was personally reviewed and demonstrates:  NSR, tall T wave  Telemetry:  Telemetry was personally reviewed and demonstrates:  SR  Relevant CV Studies: AS above    Laboratory Data:  High Sensitivity Troponin:   Recent Labs  Lab 06/19/20 1001 06/19/20 1150  TROPONINIHS 7 6     Chemistry Recent Labs  Lab 06/19/20 1001  NA 129*  K 6.4*  CL 100  CO2 21*  GLUCOSE 516*  BUN 29*  CREATININE 1.90*  CALCIUM 9.5  GFRNONAA 42*  ANIONGAP 8    Recent Labs  Lab 06/19/20 1150  PROT 7.3  ALBUMIN 3.0*  AST 57*  ALT 90*  ALKPHOS 777*  BILITOT 1.0   Hematology Recent Labs  Lab 06/19/20 1001  WBC 8.5  RBC 4.98  HGB 12.5*  HCT 40.4  MCV 81.1  MCH 25.1*  MCHC 30.9  RDW 17.4*  PLT 335  Radiology/Studies:  CT ABDOMEN PELVIS WO CONTRAST  Result Date: 06/19/2020 CLINICAL DATA:  Abdominal pain EXAM: CT ABDOMEN AND PELVIS WITHOUT CONTRAST TECHNIQUE: Multidetector CT imaging of the abdomen and pelvis was performed following the standard protocol without IV contrast. COMPARISON:  None. FINDINGS: Lower chest: No acute abnormality. Hepatobiliary: Liver is within normal limits. Gallbladder demonstrates dense calcification within likely related to contraction and multiple small gallstones. No ductal dilatation is seen. Pancreas: Unremarkable. No pancreatic ductal dilatation or surrounding inflammatory changes. Spleen: Normal in size without focal abnormality. Adrenals/Urinary Tract: Adrenal glands are within normal limits bilaterally. Kidneys show a normal appearance with the exception of small nonobstructing lower pole left renal stone. Ureters are within normal limits bilaterally. No ureteral stones or obstructive changes are noted. The bladder is well distended. Stomach/Bowel: The appendix is within normal limits. No obstructive or inflammatory changes of the colon are seen. Small bowel and stomach appear within normal limits. Vascular/Lymphatic: Aortic atherosclerosis. No enlarged abdominal or pelvic lymph nodes. Reproductive: Prostate is unremarkable. Other: No abdominal wall hernia or abnormality. No abdominopelvic ascites. Musculoskeletal:  Sclerotic focus is noted in the left iliac wing likely related to a bone island. IMPRESSION: Dense calcification within the gallbladder likely related to decompression and multiple small stones within. No ductal dilatation is seen. Nonobstructing lower pole left renal stone. Normal-appearing appendix. Electronically Signed   By: Alcide Clever M.D.   On: 06/19/2020 12:47   DG Chest 2 View  Result Date: 06/19/2020 CLINICAL DATA:  Shortness of breath.  Recent chest pain EXAM: CHEST - 2 VIEW COMPARISON:  None. FINDINGS: Lungs are clear. Heart size and pulmonary vascularity are normal. No adenopathy. No pneumothorax. No bone lesions. IMPRESSION: Lungs clear.  Cardiac silhouette normal. Electronically Signed   By: Bretta Bang III M.D.   On: 06/19/2020 10:38     Assessment and Plan:   1. Exertional chest pain and shortness of breath - Symptoms is concerning for stable angina.  He has pending outpatient coronary CT next month. -Troponin negative here -Patient's cardiac risk factor includes hypertension, uncontrolled diabetes and family history of CAD -Patient will need ischemic evaluation once stable renal function and GI issue -Get echocardiogram this admission  2.  Cardiac mass -Questionable aortic valve etiology -Patient has pending cardiac MRI -We will get echocardiogram and then decide further work-up (TEE vs cMRI) -? Rule out endocarditis given recent groin abscess s/p I&D. Will get baseline blood cultures.   3.  Acute on chronic kidney disease stage III -Seems like baseline renal function around 1.5-1.7 -Avoid nephrotoxic agent  4.  Abdominal pain -Patient reports history of constipation -Elevated LFTs and lipase -Pending right upper quadrant ultrasound -Work-up per admitting team -Recently had I&D done for left groin abscess  5.  Diabetes mellitus with hyperglycemia -Blood sugar severely elevated -Given AKI hold empagliflozin and ACE -Management per primary team  6.   Hypertension -Hold home lisinopril given renal function -Continue amlodipine  7.  Hyperkalemia -Treatment given   Risk Assessment/Risk Scores:   TIMI Risk Score for Unstable Angina or Non-ST Elevation MI:   The patient's TIMI risk score is 4, which indicates a 20% risk of all cause mortality, new or recurrent myocardial infarction or need for urgent revascularization in the next 14 days.{   For questions or updates, please contact [facility] Please consult [email or web address] for contact info under    Lorelei Pont, [place]  06/19/2020 6:27 PM   Patient seen and examined.  Agree with above documentation.  Joshua Nash is a 55 year old male with a history of T2DM, hypertension, recent I&D of left groin abscess who we are consulted to see for chest pain/dyspnea at the request of Joshua Nash.  He follows with Dr. Tomie Chinaevankar for his chest pain, outpatient coronary CTA was planned.  However he reported worsening symptoms this morning and at appointment with Dr. Tomie Chinaevankar, he was recommended he be admitted to the ED.  He reports he has been having 2 to 3 months of worsening shortness of breath and chest pain.  Describes left-sided chest pain, describes as stabbing pain but also sometimes tightness.  Often occurs with activity.  Denies any chest pain today.  However reports has been having abdominal pain, particularly in the epigastric area.  Also reports having nausea and chills.  On presentation to the ED, initial vital signs notable for BP 145/85, pulse 95, SPO2 100% on room air.  Labs notable for creatinine 1.9, glucose 516, potassium 6.4, sodium 129, troponin 7 >6, hemoglobin 12.5, platelets 335, WBC 8.5, COVID-19 negative, alkaline phosphatase 777, AST 57, ALT 90, lipase 79, chest x-ray unremarkable, CT abdomen/pelvis with dense calcifications within gallbladder likely due to decompression and multiple small stones.  EKG shows normal sinus rhythm, rate 96, no ST abnormalities.   Telemetry shows sinus rhythm with rate 90s.  On exam, patient is alert and oriented, regular rate and rhythm, no murmurs, lungs CTAB, no LE edema or JVD.  For his chest pain/dyspnea, he warrants ischemic evaluation at some point.  However would hold off for now until acute issues resolved.  He is having abdominal pain/chills with elevated LFTs with cholestatic pattern.  Right upper quadrant ultrasound ordered.  He also has AKI on CKD IIIa, creatinine 1.9 currently.  There was report of aortic valve mass from outside echocardiogram for which a cardiac MRI has been ordered.  Cardiac MRI is in general not the best imaging modality to evaluate valvular masses, TEE is preferred.  Recommend starting with TTE.  Given possible aortic valve mass and recent infectious issues with drainage of left groin abscess, will check blood cultures to evaluate for bacteremia.  Little Ishikawahristopher L Elwanda Moger, MD

## 2020-06-19 NOTE — ED Provider Notes (Signed)
[facility] EMERGENCY DEPARTMENT Provider Note   CSN: [id number] Arrival date & time: 06/19/20  3606     History Chief Complaint  Patient presents with  . Chest Pain    Joshua Nash is a 55 y.o. male.  HPI Patient is a 55 year old male with past medical history significant for angina, cardiac max, DM 2, tachycardia, HTN  Patient here with multiple complaints today.  He is primarily complaining of shortness of breath with exertion for 2 or 3 months.  He states that he has also felt more fatigued, occasionally lightheaded.  He states he also has chest pain occasionally.  It seems that it is generally nonexertional and nonpleuritic chest pain.  He states that his sternal EKG and intermittent.  He states he was seen at the cardiologist clinic this morning and was told that he had abnormal lab work and told to come to the emergency room.  Denies any nausea or vomiting. No recent surgeries, hospitalization, long travel, hemoptysis, estrogen containing OCP, cancer history.  No unilateral leg swelling.  No history of PE or VTE.      Past Medical History:  Diagnosis Date  . Abnormal electrocardiogram (ECG) (EKG)   . Angina pectoris ([place]) 06/01/2020  . BMI 26.0-26.9,adult   . BMI 27.0-27.9,adult   . Cardiac mass 06/01/2020  . COVID-19 03/18/2020  . Diabetes mellitus without complication ([place])   . Elevated liver function tests   . Hypertension   . Post-COVID chronic fatigue   . Screening for depression   . Tachycardia   . Type 2 diabetes mellitus with proteinuria (HCC)   . Type 2 diabetes mellitus with proteinuria [facility])     Patient Active Problem List   Diagnosis Date Noted  . Dyspnea on exertion 06/19/2020  . Renal insufficiency 06/19/2020  . AKI (acute kidney injury) ([place]) 06/19/2020  . Angina pectoris ([place]) 06/01/2020  . Cardiac mass 06/01/2020  . Diabetes mellitus without complication ([place])   . Type 2 diabetes mellitus with proteinuria (HCC)   .  Tachycardia   . Abnormal electrocardiogram (ECG) (EKG)   . BMI 27.0-27.9,adult   . Elevated liver function tests   . Hypertension   . Post-COVID chronic fatigue   . Screening for depression   . COVID-19 03/18/2020    Past Surgical History:  Procedure Laterality Date  . LEG SURGERY  02/2020       Family History  Problem Relation Age of Onset  . Diabetes Mother   . Hyperlipidemia Mother   . Hypertension Mother   . Diabetes Father   . Hyperlipidemia Father   . Hypertension Father     Social History   Tobacco Use  . Smoking status: Never Smoker  . Smokeless tobacco: Never Used  Substance Use Topics  . Alcohol use: Yes    Comment: occ  . Drug use: Never    Home Medications Prior to Admission medications   Medication Sig Start Date End Date Taking? Authorizing Provider  amLODipine (NORVASC) 10 MG tablet Take 1 tablet by mouth daily. 03/28/20 03/28/21  [provider]  empagliflozin (JARDIANCE) 25 MG TABS tablet Take 25 mg by mouth daily.    [provider]  hydrOXYzine (ATARAX/VISTARIL) 25 MG tablet Take 25 mg by mouth 3 (three) times daily as needed for itching.    [provider]  LANTUS SOLOSTAR 100 UNIT/ML Solostar Pen Inject 40 Units into the skin daily. 06/11/20   [provider]  lisinopril (ZESTRIL) 10 MG tablet Take 10  mg by mouth daily.    [provider]  nitroGLYCERIN (NITROSTAT) 0.4 MG SL tablet Place 0.4 mg under the tongue every 5 (five) minutes as needed for chest pain.    [provider]    Allergies    Patient has no known allergies.  Review of Systems   Review of Systems  Constitutional: Positive for fatigue. Negative for chills and fever.  HENT: Negative for congestion.   Eyes: Negative for pain.  Respiratory: Positive for shortness of breath. Negative for cough.   Cardiovascular: Positive for chest pain. Negative for leg swelling.  Gastrointestinal: Negative for abdominal pain, diarrhea, nausea  and vomiting.  Genitourinary: Negative for dysuria.  Musculoskeletal: Positive for myalgias.  Skin: Negative for rash.  Neurological: Positive for light-headedness. Negative for dizziness and headaches.    Physical Exam Updated Vital Signs BP (!) 153/99   Pulse 92   Temp 98.1 F (36.7 C) (Oral)   Resp 16   SpO2 99%   Physical Exam Vitals and nursing note reviewed.  Constitutional:      General: He is not in acute distress.    Comments: Somewhat fatigued appearing 55 year old male in no acute distress.  HENT:     Head: Normocephalic and atraumatic.     Nose: Nose normal.  Eyes:     General: No scleral icterus. Neck:     Comments: No significant JVP Cardiovascular:     Rate and Rhythm: Normal rate and regular rhythm.     Pulses: Normal pulses.     Heart sounds: Normal heart sounds.  Pulmonary:     Effort: Pulmonary effort is normal. No respiratory distress.     Breath sounds: No wheezing.  Abdominal:     Palpations: Abdomen is soft.     Tenderness: There is abdominal tenderness. There is no guarding or rebound.     Comments: Generalized lower abdominal pain.  No rebound or focal tenderness. No guarding.   Musculoskeletal:     Cervical back: Normal range of motion.     Comments: No significant lower extremity edema  Skin:    General: Skin is warm and dry.     Capillary Refill: Capillary refill takes less than 2 seconds.  Neurological:     Mental Status: He is alert. Mental status is at baseline.  Psychiatric:        Mood and Affect: Mood normal.        Behavior: Behavior normal.     ED Results / Procedures / Treatments   Labs (all labs ordered are listed, but only abnormal results are displayed) Labs Reviewed  BASIC METABOLIC PANEL - Abnormal; Notable for the following components:      Result Value   Sodium 129 (*)    Potassium 6.4 (*)    CO2 21 (*)    Glucose, Bld 516 (*)    BUN 29 (*)    Creatinine, Ser 1.90 (*)    GFR, Estimated 42 (*)    All other  components within normal limits  CBC - Abnormal; Notable for the following components:   Hemoglobin 12.5 (*)    MCH 25.1 (*)    RDW 17.4 (*)    All other components within normal limits  HEPATIC FUNCTION PANEL - Abnormal; Notable for the following components:   Albumin 3.0 (*)    AST 57 (*)    ALT 90 (*)    Alkaline Phosphatase 777 (*)    Bilirubin, Direct 0.3 (*)    All other  components within normal limits  LIPASE, BLOOD - Abnormal; Notable for the following components:   Lipase 79 (*)    All other components within normal limits  CBG MONITORING, ED - Abnormal; Notable for the following components:   Glucose-Capillary 407 (*)    All other components within normal limits  SARS CORONAVIRUS 2 (TAT 6-24 HRS)  TROPONIN I (HIGH SENSITIVITY)  TROPONIN I (HIGH SENSITIVITY)    EKG EKG Interpretation  Date/Time:  Monday June 19 2020 09:57:04 EDT Ventricular Rate:  96 PR Interval:  148 QRS Duration: 100 QT Interval:  344 QTC Calculation: 434 R Axis:   100 Text Interpretation: Normal sinus rhythm Rightward axis T wave abnormality Borderline ECG Confirmed by Carmin Muskrat [phone]) on 06/19/2020 11:19:40 AM   Radiology CT ABDOMEN PELVIS WO CONTRAST  Result Date: 06/19/2020 CLINICAL DATA:  Abdominal pain EXAM: CT ABDOMEN AND PELVIS WITHOUT CONTRAST TECHNIQUE: Multidetector CT imaging of the abdomen and pelvis was performed following the standard protocol without IV contrast. COMPARISON:  None. FINDINGS: Lower chest: No acute abnormality. Hepatobiliary: Liver is within normal limits. Gallbladder demonstrates dense calcification within likely related to contraction and multiple small gallstones. No ductal dilatation is seen. Pancreas: Unremarkable. No pancreatic ductal dilatation or surrounding inflammatory changes. Spleen: Normal in size without focal abnormality. Adrenals/Urinary Tract: Adrenal glands are within normal limits bilaterally. Kidneys show a normal appearance with the  exception of small nonobstructing lower pole left renal stone. Ureters are within normal limits bilaterally. No ureteral stones or obstructive changes are noted. The bladder is well distended. Stomach/Bowel: The appendix is within normal limits. No obstructive or inflammatory changes of the colon are seen. Small bowel and stomach appear within normal limits. Vascular/Lymphatic: Aortic atherosclerosis. No enlarged abdominal or pelvic lymph nodes. Reproductive: Prostate is unremarkable. Other: No abdominal wall hernia or abnormality. No abdominopelvic ascites. Musculoskeletal: Sclerotic focus is noted in the left iliac wing likely related to a bone island. IMPRESSION: Dense calcification within the gallbladder likely related to decompression and multiple small stones within. No ductal dilatation is seen. Nonobstructing lower pole left renal stone. Normal-appearing appendix. Electronically Signed   By: Inez Catalina M.D.   On: 06/19/2020 12:47   DG Chest 2 View  Result Date: 06/19/2020 CLINICAL DATA:  Shortness of breath.  Recent chest pain EXAM: CHEST - 2 VIEW COMPARISON:  None. FINDINGS: Lungs are clear. Heart size and pulmonary vascularity are normal. No adenopathy. No pneumothorax. No bone lesions. IMPRESSION: Lungs clear.  Cardiac silhouette normal. Electronically Signed   By: Lowella Grip III M.D.   On: 06/19/2020 10:38    Procedures Procedures   Medications Ordered in ED Medications  insulin aspart (novoLOG) injection 8 Units (8 Units Subcutaneous Given 06/19/20 1209)  sodium zirconium cyclosilicate (LOKELMA) packet 10 g (10 g Oral Given 06/19/20 1308)  sodium chloride 0.9 % bolus 1,000 mL (0 mLs Intravenous Stopped 06/19/20 1308)  albuterol (VENTOLIN HFA) 108 (90 Base) MCG/ACT inhaler 6 puff (6 puffs Inhalation Given 06/19/20 1210)    ED Course  I have reviewed the triage vital signs and the nursing notes.  Pertinent labs & imaging results that were available during my care of the patient  were reviewed by me and considered in my medical decision making (see chart for details).  Patient is a Spanish-speaking 55 year old male with past medical history detailed in HPI presented today with several complaints including abnormal lab work found by cardiologist earlier today.  Patient has reassuring physical exam.  Does have some lower abdominal tenderness.  CT abdomen pelvis without contrast obtained given his worsening kidney function.  This was relatively unremarkable apart from a calcified finding in the gallbladder.  He does not have any focal right upper quadrant abdominal pain and has a negative Murphy sign. He notably has a history of abnormal LFTs and his LFTs are mildly abnormal today with significantly elevated alk phos.  CBC without leukocytosis or profound anemia.  Lipase marginally elevated.pancreatitis suspect somewhat dehydration/hemoconcentration.  BMP notable for potassium of 6.4 patient may have very mildly peaked T waves no comparison EKG in EMR.  Blood sugar is 516.  No anion gap.  Creatinine mildly worse from prior. Hyperkalemia may be secondary to worsening renal function.  Patient given IV fluids and albuterol, insulin, lokelma.   Chest x-ray unremarkable.  Will discuss with hospitalist service.  Clinical Course as of 06/19/20 1524  Mon Jun 19, 2020  1425 Discussed with family medicine resident who will assess pt and admit.  [WF]    Clinical Course User Index [WF] Tedd Sias, [place]   MDM Rules/Calculators/A&P                         Patient agreeable with plan to admit.    Final Clinical Impression(s) / ED Diagnoses Final diagnoses:  RUQ pain  Chest pain, unspecified type  LFTs abnormal  Hyperkalemia    Rx / DC Orders ED Discharge Orders    None       Tedd Sias, [place] 06/19/20 1527    Carmin Muskrat, MD 06/20/20 [phone]

## 2020-06-19 NOTE — ED Notes (Signed)
Attempted report x1. 

## 2020-06-19 NOTE — Progress Notes (Signed)
Attempted to call back for report for this pt at 1538. Will await return call.

## 2020-06-19 NOTE — Patient Instructions (Signed)

## 2020-06-20 ENCOUNTER — Observation Stay (HOSPITAL_COMMUNITY): Payer: Medicaid Other

## 2020-06-20 ENCOUNTER — Encounter (HOSPITAL_COMMUNITY): Payer: Self-pay | Admitting: Family Medicine

## 2020-06-20 ENCOUNTER — Observation Stay (HOSPITAL_BASED_OUTPATIENT_CLINIC_OR_DEPARTMENT_OTHER): Payer: Medicaid Other

## 2020-06-20 DIAGNOSIS — R7989 Other specified abnormal findings of blood chemistry: Secondary | ICD-10-CM

## 2020-06-20 DIAGNOSIS — R945 Abnormal results of liver function studies: Secondary | ICD-10-CM

## 2020-06-20 DIAGNOSIS — R079 Chest pain, unspecified: Secondary | ICD-10-CM

## 2020-06-20 DIAGNOSIS — R7401 Elevation of levels of liver transaminase levels: Secondary | ICD-10-CM | POA: Diagnosis not present

## 2020-06-20 DIAGNOSIS — E1122 Type 2 diabetes mellitus with diabetic chronic kidney disease: Secondary | ICD-10-CM

## 2020-06-20 DIAGNOSIS — L299 Pruritus, unspecified: Secondary | ICD-10-CM | POA: Diagnosis not present

## 2020-06-20 DIAGNOSIS — R0789 Other chest pain: Secondary | ICD-10-CM | POA: Diagnosis not present

## 2020-06-20 DIAGNOSIS — N179 Acute kidney failure, unspecified: Secondary | ICD-10-CM | POA: Diagnosis not present

## 2020-06-20 DIAGNOSIS — R0600 Dyspnea, unspecified: Secondary | ICD-10-CM | POA: Diagnosis not present

## 2020-06-20 DIAGNOSIS — I209 Angina pectoris, unspecified: Secondary | ICD-10-CM

## 2020-06-20 DIAGNOSIS — I5189 Other ill-defined heart diseases: Secondary | ICD-10-CM | POA: Diagnosis not present

## 2020-06-20 DIAGNOSIS — N183 Chronic kidney disease, stage 3 unspecified: Secondary | ICD-10-CM

## 2020-06-20 HISTORY — DX: Type 2 diabetes mellitus with diabetic chronic kidney disease: E11.22

## 2020-06-20 LAB — LIPID PANEL
Cholesterol: 512 mg/dL — ABNORMAL HIGH (ref 0–200)
HDL: 51 mg/dL (ref >40)
LDL Cholesterol: UNDETERMINED mg/dL (ref 0–99)
Total CHOL/HDL Ratio: 10 RATIO
Triglycerides: 474 mg/dL — ABNORMAL HIGH (ref <150)
VLDL: UNDETERMINED mg/dL (ref 0–40)

## 2020-06-20 LAB — LDL CHOLESTEROL, DIRECT: Direct LDL: 281.2 mg/dL — ABNORMAL HIGH (ref 0–99)

## 2020-06-20 LAB — COMPREHENSIVE METABOLIC PANEL
ALT: 78 U/L — ABNORMAL HIGH (ref 0–44)
AST: 65 U/L — ABNORMAL HIGH (ref 15–41)
Albumin: 2.5 g/dL — ABNORMAL LOW (ref 3.5–5.0)
Alkaline Phosphatase: 649 U/L — ABNORMAL HIGH (ref 38–126)
Anion gap: 6 (ref 5–15)
BUN: 23 mg/dL — ABNORMAL HIGH (ref 6–20)
CO2: 19 mmol/L — ABNORMAL LOW (ref 22–32)
Calcium: 9.2 mg/dL (ref 8.9–10.3)
Chloride: 109 mmol/L (ref 98–111)
Creatinine, Ser: 1.82 mg/dL — ABNORMAL HIGH (ref 0.61–1.24)
GFR, Estimated: 44 mL/min — ABNORMAL LOW (ref >60)
Glucose, Bld: 155 mg/dL — ABNORMAL HIGH (ref 70–99)
Potassium: 5 mmol/L (ref 3.5–5.1)
Sodium: 134 mmol/L — ABNORMAL LOW (ref 135–145)
Total Bilirubin: 0.9 mg/dL (ref 0.3–1.2)
Total Protein: 6.2 g/dL — ABNORMAL LOW (ref 6.5–8.1)

## 2020-06-20 LAB — GLUCOSE, CAPILLARY
Glucose-Capillary: 158 mg/dL — ABNORMAL HIGH (ref 70–99)
Glucose-Capillary: 132 mg/dL — ABNORMAL HIGH (ref 70–99)
Glucose-Capillary: 157 mg/dL — ABNORMAL HIGH (ref 70–99)
Glucose-Capillary: 167 mg/dL — ABNORMAL HIGH (ref 70–99)
Glucose-Capillary: 111 mg/dL — ABNORMAL HIGH (ref 70–99)

## 2020-06-20 LAB — FERRITIN: Ferritin: 432 ng/mL — ABNORMAL HIGH (ref 24–336)

## 2020-06-20 LAB — FOLATE: Folate: 13.6 ng/mL (ref >5.9)

## 2020-06-20 LAB — ECHOCARDIOGRAM COMPLETE
Area-P 1/2: 3.97 cm2
Height: 65 in
S' Lateral: 2.8 cm
Weight: 2590.4 oz

## 2020-06-20 LAB — TSH: TSH: 4.652 u[IU]/mL — ABNORMAL HIGH (ref 0.350–4.500)

## 2020-06-20 LAB — VITAMIN B12: Vitamin B-12: 312 pg/mL (ref 180–914)

## 2020-06-20 LAB — VITAMIN D 25 HYDROXY (VIT D DEFICIENCY, FRACTURES): Vit D, 25-Hydroxy: 12.45 ng/mL — ABNORMAL LOW (ref 30–100)

## 2020-06-20 IMAGING — MR MR ABDOMEN WO/W CM MRCP
15 of 23 series · 22 of 48 positions shown · IV contrast (gadavist)
Comparison: [DATE] ultrasound and [DATE] CT.

CLINICAL DATA: Anorexia. Right upper quadrant pain. Nausea.
Malaise. Gallstones. Clinical concern for autoimmune liver disease.

EXAM:
MRI ABDOMEN WITHOUT AND WITH CONTRAST (INCLUDING MRCP)
TECHNIQUE: Multiplanar multisequence MR imaging of the abdomen was performed
both before and after the administration of intravenous contrast.
Heavily T2-weighted images of the biliary and pancreatic ducts were
obtained, and three-dimensional MRCP images were rendered by post
processing.
CONTRAST:  7.5mL GADAVIST GADOBUTROL 1 MMOL/ML IV SOLN

[Series 3: cor ssfse nav · coronal · 6.0mm · 0.74mm/px · 1 of 44 slices shown]
[im 1/44]
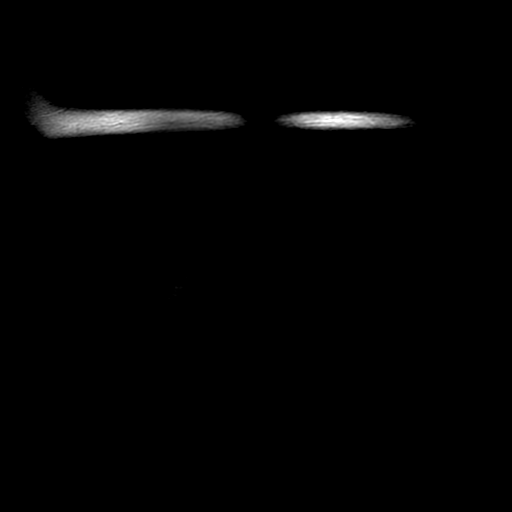

[Series 4: ax ssfse nav · axial · 6.0mm · 0.74mm/px · 1 of 52 slices shown]
[im 1/52]
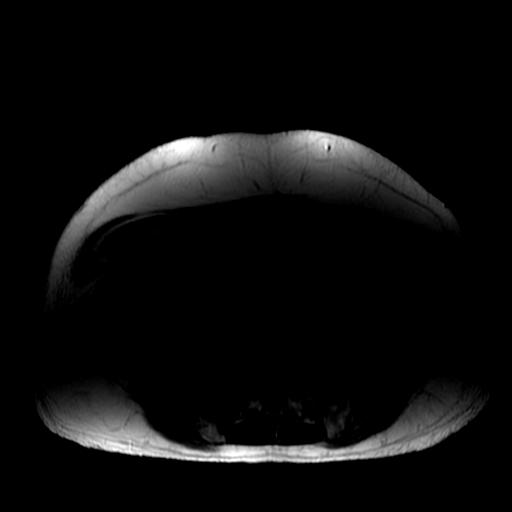

[Series 5: T2 fat-sat · axial · 6.0mm · 0.74mm/px · 1 of 51 slices shown]
[im 1/51]
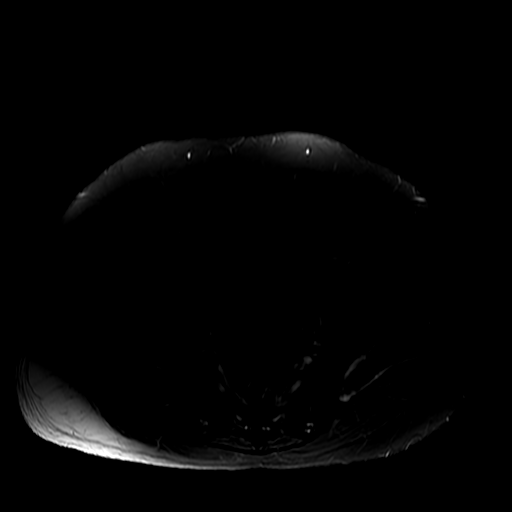

[Series 6: DWI b500 · axial · 8.0mm · 1.48mm/px · 1 of 66 slices shown]
[im 1/66]
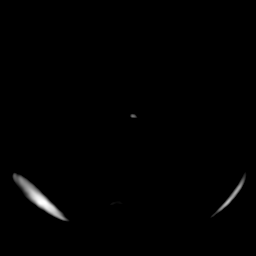

[Series 7: T1 dynamic · axial · 5.0mm · 0.74mm/px · 1 of 112 slices shown (1 of 5)]
[im 1/112]
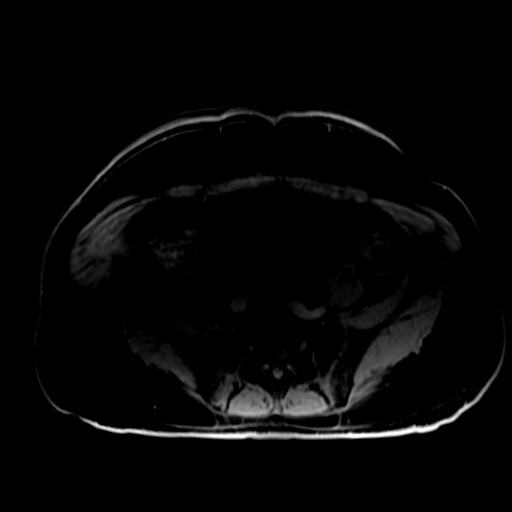

[Series 9: radial 2d thick · coronal · 40.0mm · 0.86mm/px · 1 of 6 slices shown]
[im 1/6]
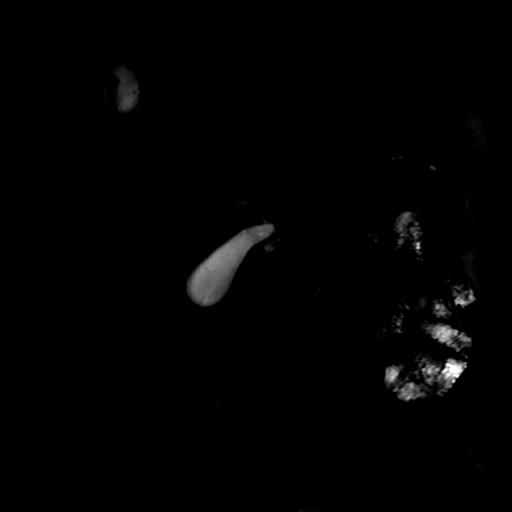

[Series 10: bSSFP · coronal · 6.0mm · 0.74mm/px · 1 of 44 slices shown]
[im 1/44]
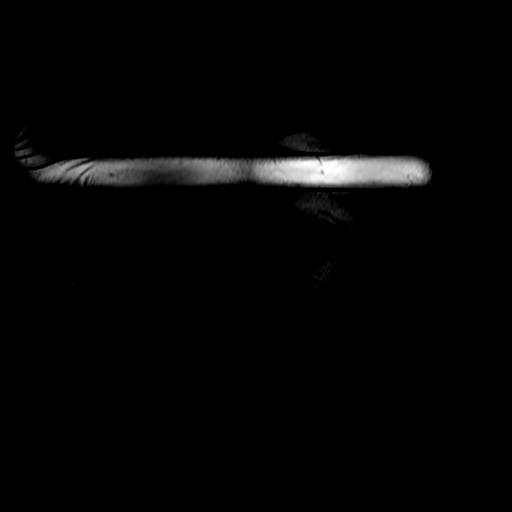

[Series 12: T1 dynamic · coronal · 3.3mm · 1.48mm/px · 3 of 152 slices shown (2 of 5)]
[im 1/152]
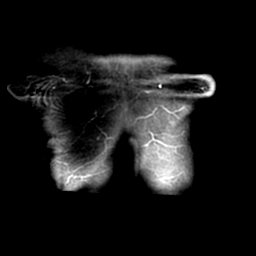
[im 76/152]
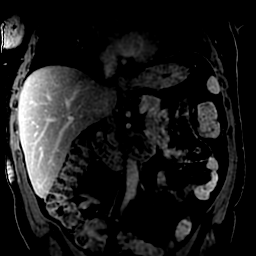
[im 152/152]
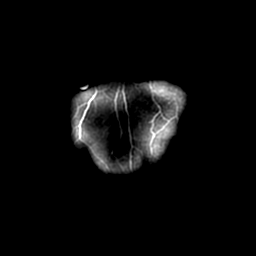

[Series 650: ADC · axial · 8.0mm · 1.48mm/px · 1 of 33 slices shown]
[im 1/33]
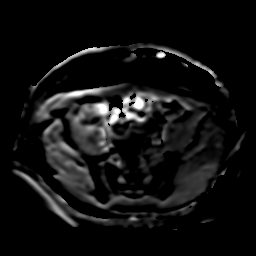

[Series 700: T1 dynamic · axial · 5.0mm · 0.74mm/px · z∈[-39,+239]mm · 2 of 112 slices shown (3 of 5)]
[im 1/112]
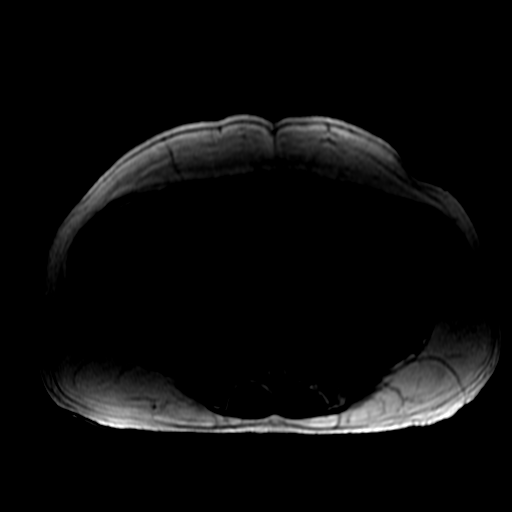
[im 112/112]
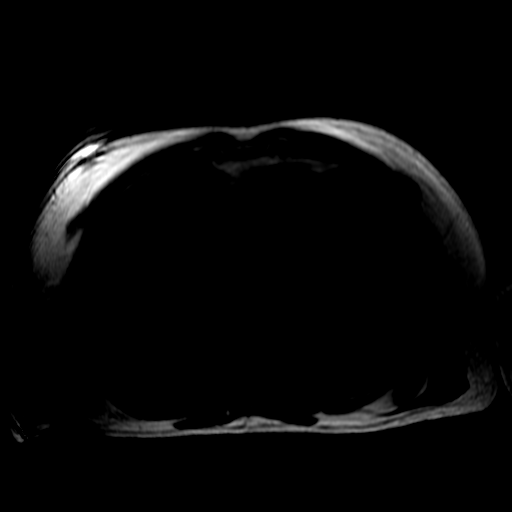

[Series 701: T1 dynamic · axial · 5.0mm · 0.74mm/px · z∈[-39,+239]mm · 2 of 112 slices shown (4 of 5)]
[im 1/112]
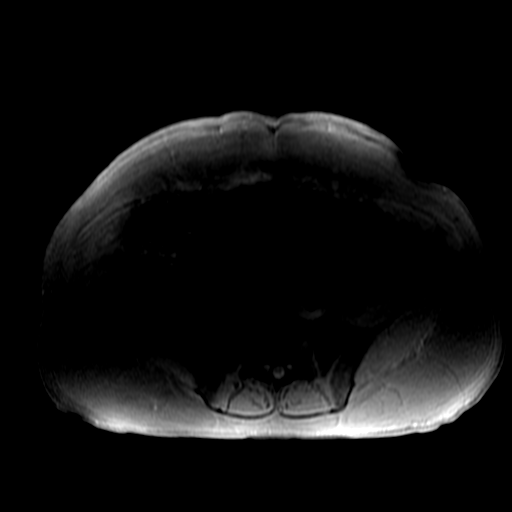
[im 112/112]
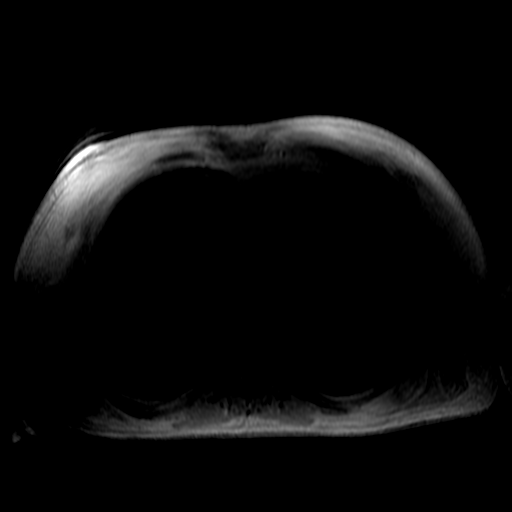

[Series 702: T1 dynamic · axial · 5.0mm · 0.74mm/px · z∈[-39,+239]mm · 2 of 112 slices shown (5 of 5)]
[im 1/112]
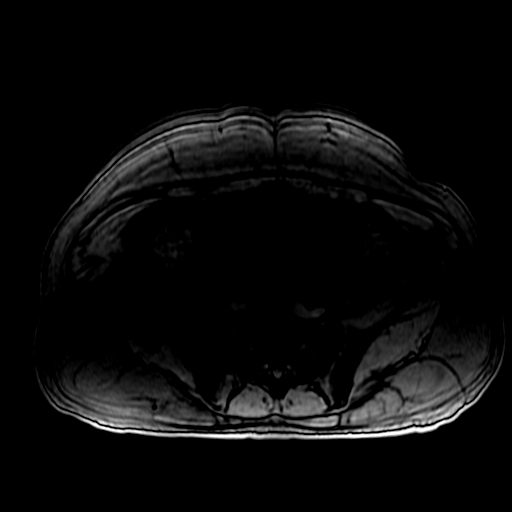
[im 112/112]
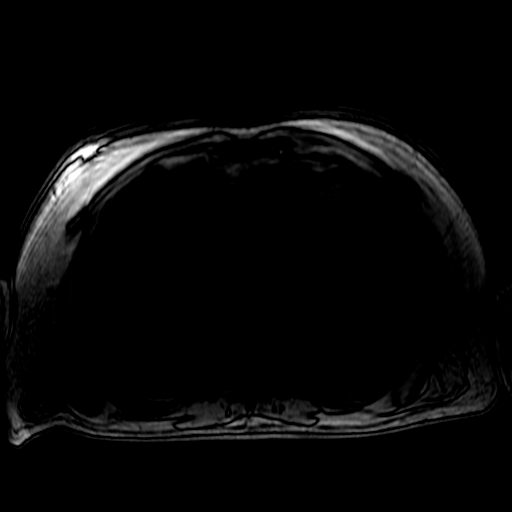

[Series 850: projection images · coronal · 1.2mm · 0.60mm/px · 1 of 12 slices shown]
[im 1/12]
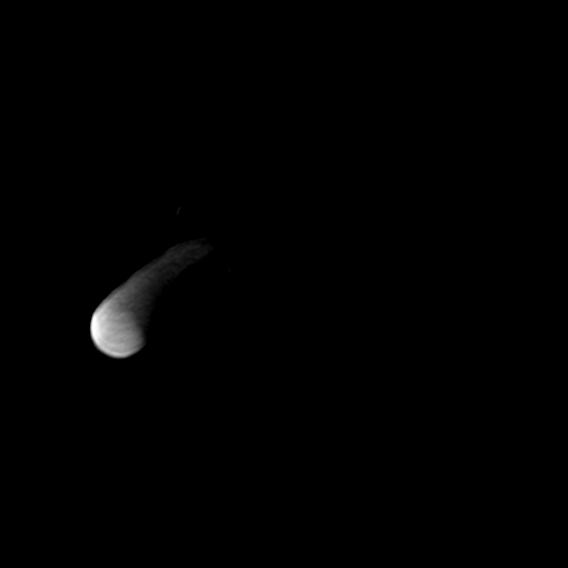

[Series 1100: T1 dynamic post-contrast · axial · non-contrast · 4.0mm · 0.74mm/px · z∈[+4,+258]mm · 3 of 128 slices shown (1 of 2)]
[im 1/128]
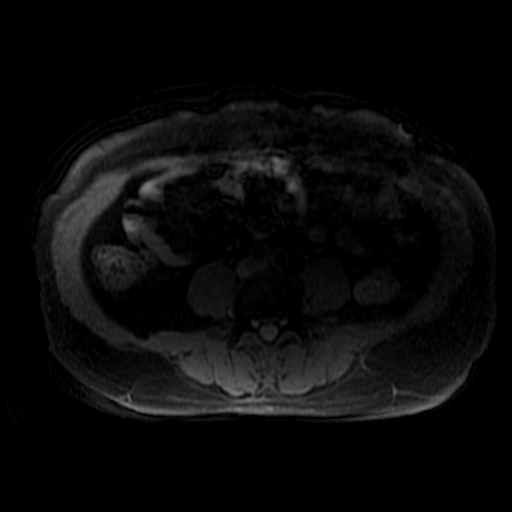
[im 64/128]
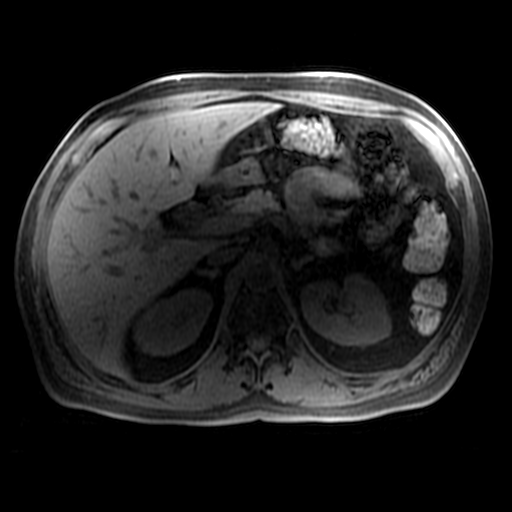
[im 128/128]
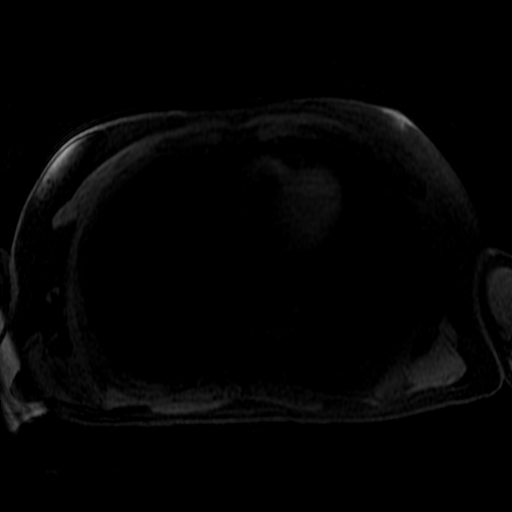

[Series 1101: T1 dynamic post-contrast · axial · non-contrast · 4.0mm · 0.74mm/px · 1 of 128 slices shown (2 of 2)]
[im 1/128]
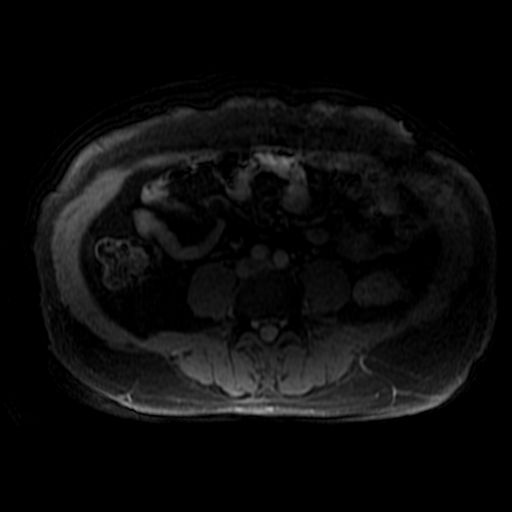

[22 of 48 positions shown; findings below may reference images not displayed]

FINDINGS: Portions of exam are mildly motion degraded.

Lower chest: Normal heart size without pericardial or pleural
effusion.

Hepatobiliary: Hepatomegaly, 19.4 cm craniocaudal. No steatosis. No
cirrhosis. No focal liver lesion.

Multiple small gallstones. No acute cholecystitis or biliary duct
dilatation. No choledocholithiasis.

Pancreas:  Normal, without mass or ductal dilatation.

Spleen:  Normal in size, without focal abnormality.

Adrenals/Urinary Tract: Normal adrenal glands. Tiny bilateral renal
cysts. No hydronephrosis.

Stomach/Bowel: Proximal gastric underdistention. Normal abdominal
small bowel loops. Colonic stool burden suggests constipation.

Vascular/Lymphatic: Aortic atherosclerosis. No retroperitoneal or
retrocrural adenopathy.

Other:  No ascites.

Musculoskeletal: No acute osseous abnormality.
IMPRESSION: 1. Mildly motion degraded exam.
2. Cholelithiasis without acute cholecystitis or biliary duct
dilatation.
3. Hepatomegaly.
4. Possible constipation.

## 2020-06-20 IMAGING — US US ABDOMEN LIMITED RUQ/ASCITES
1 series · 14 of 25 positions shown · non-contrast
Comparison: None.

CLINICAL DATA: Right upper quadrant abdominal pain

EXAM:
ULTRASOUND ABDOMEN LIMITED RIGHT UPPER QUADRANT

[Series 1: us abdomen limited ruq (liver/gb) · 14 of 77 slices shown]
[im 1/77]
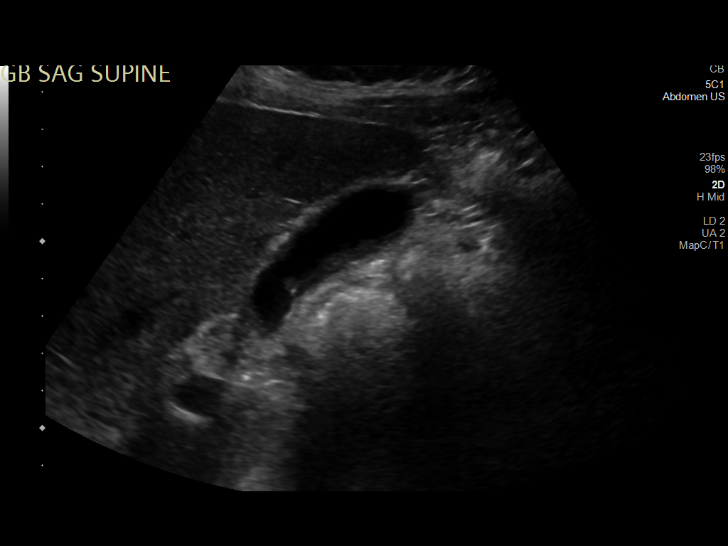
[im 7/77]
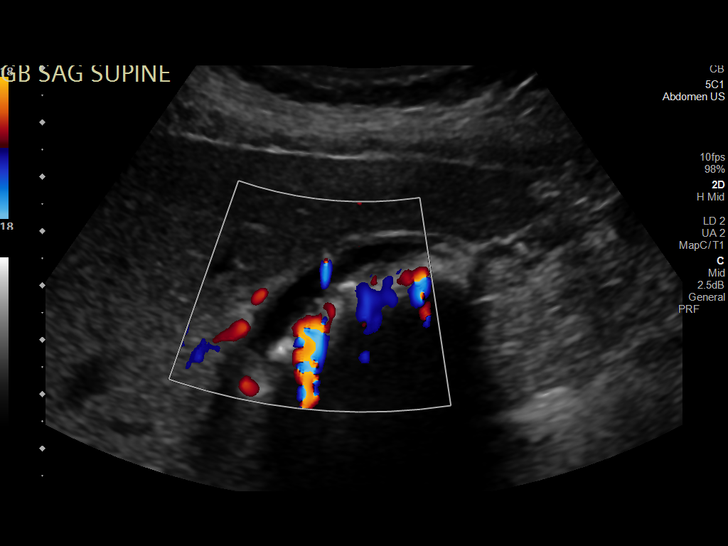
[im 13/77]
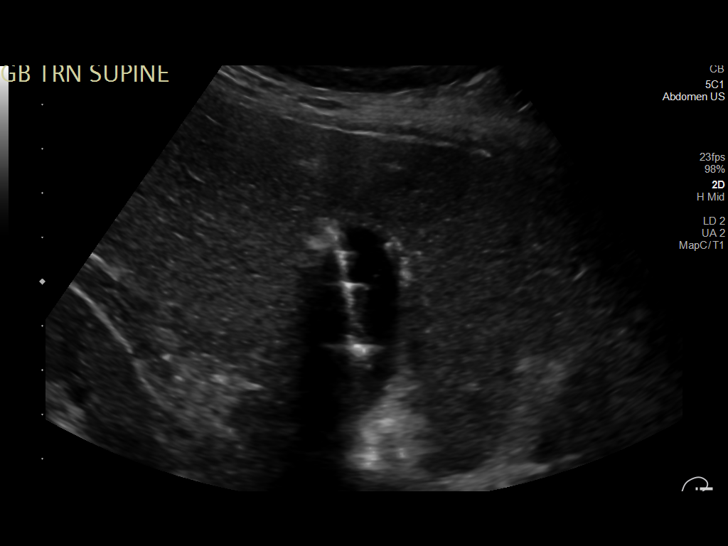
[im 20/77]
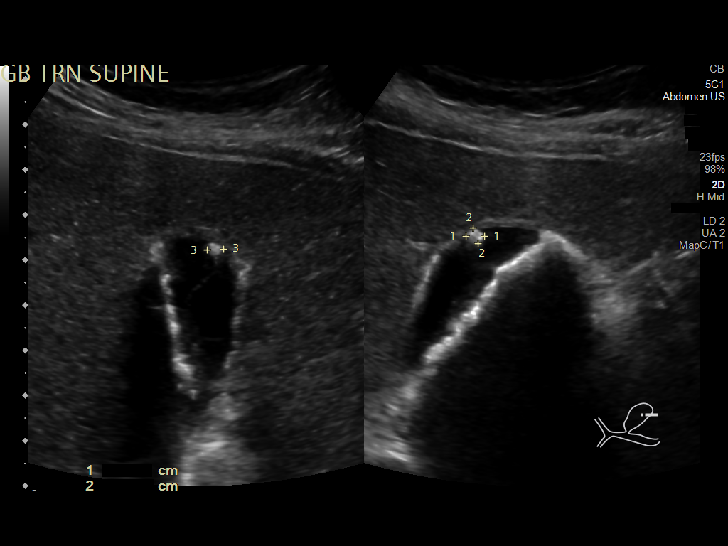
[im 26/77]
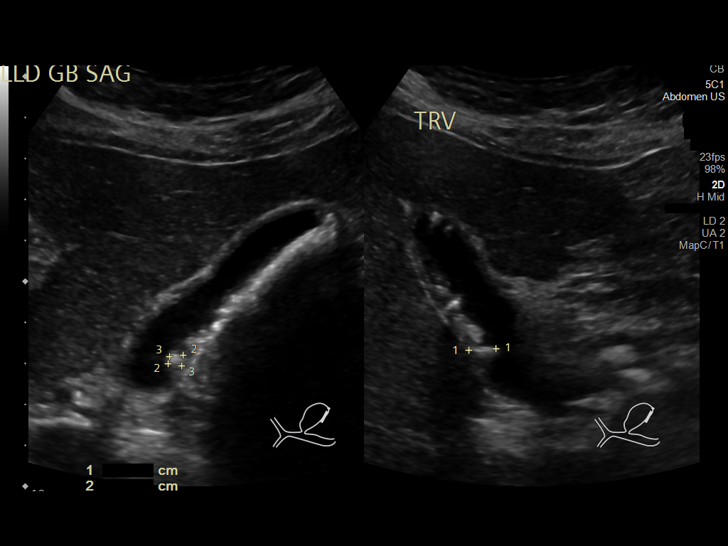
[im 29/77]
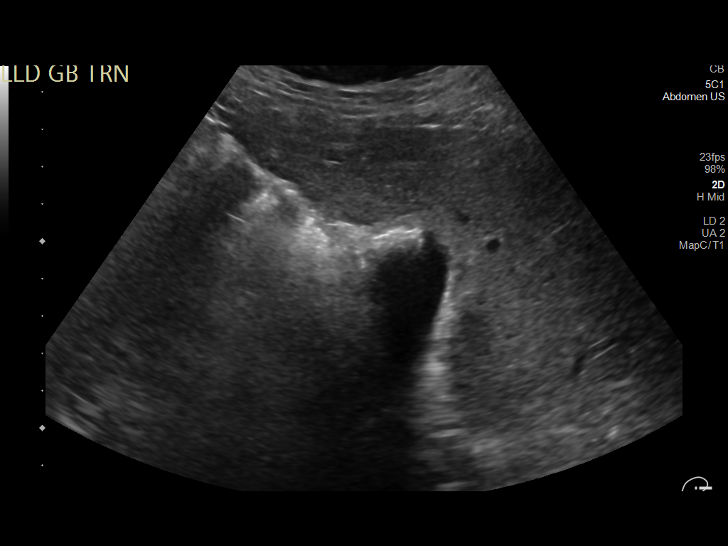
[im 35/77]
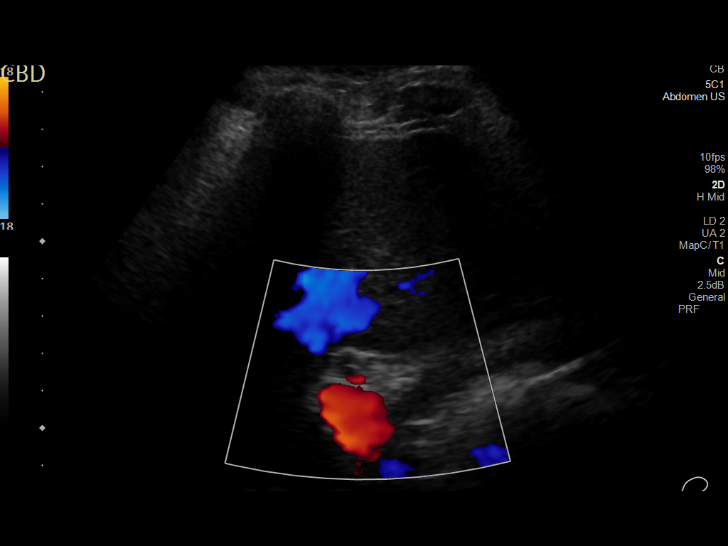
[im 42/77]
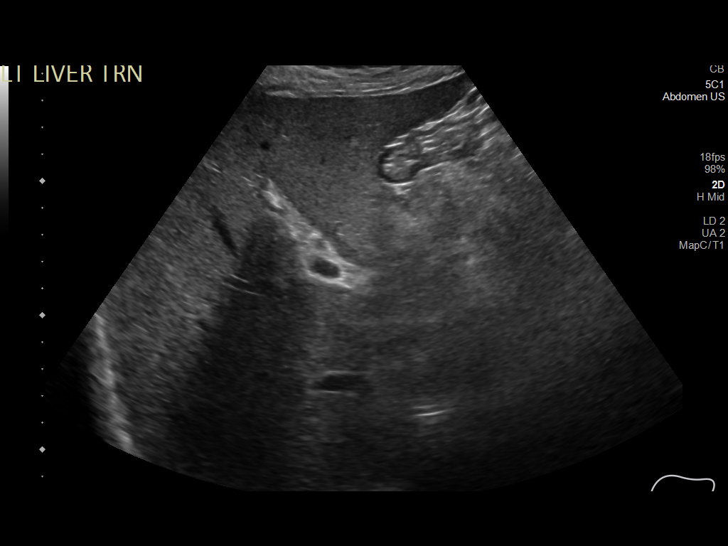
[im 48/77]
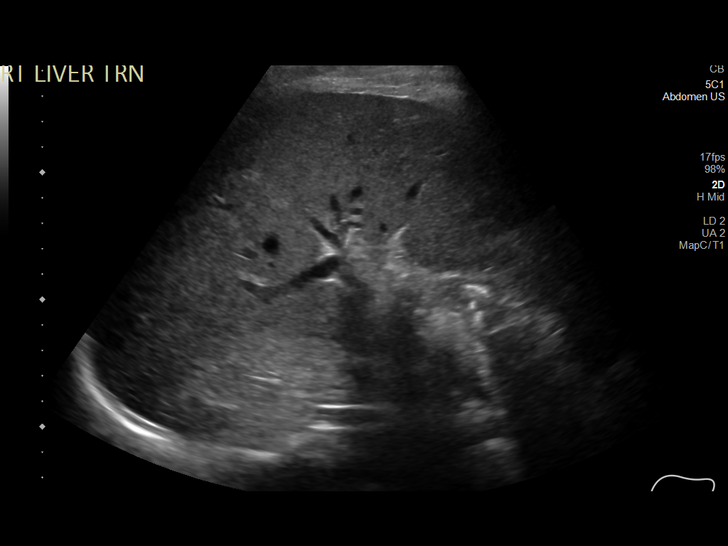
[im 51/77]
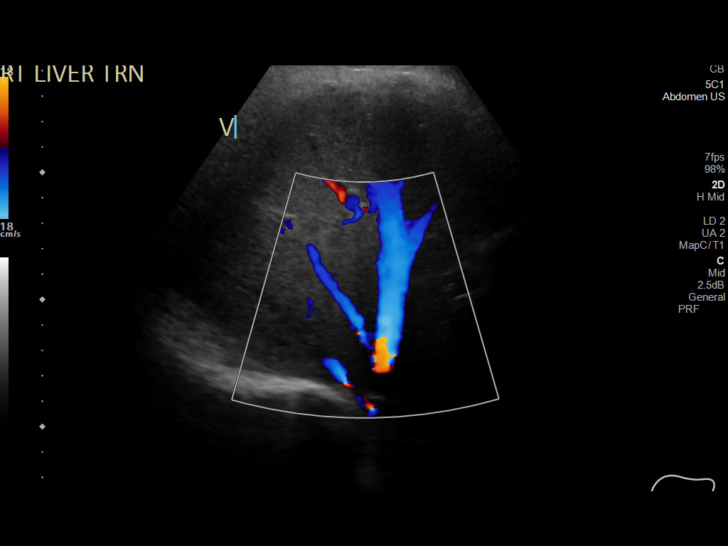
[im 58/77]
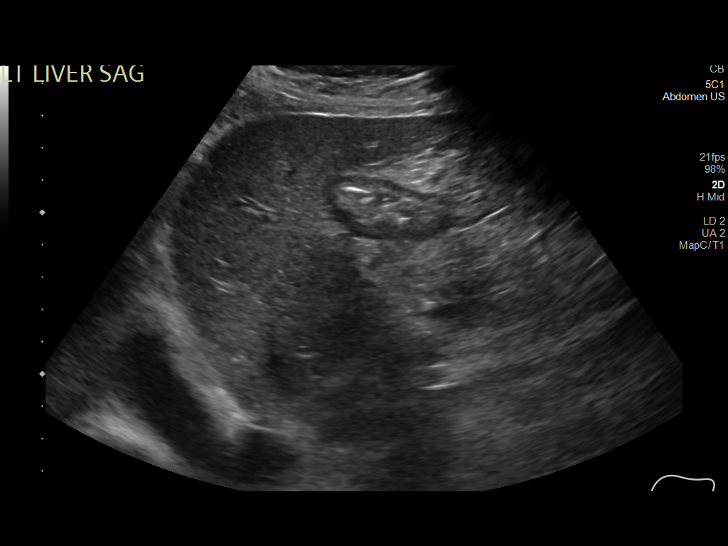
[im 64/77]
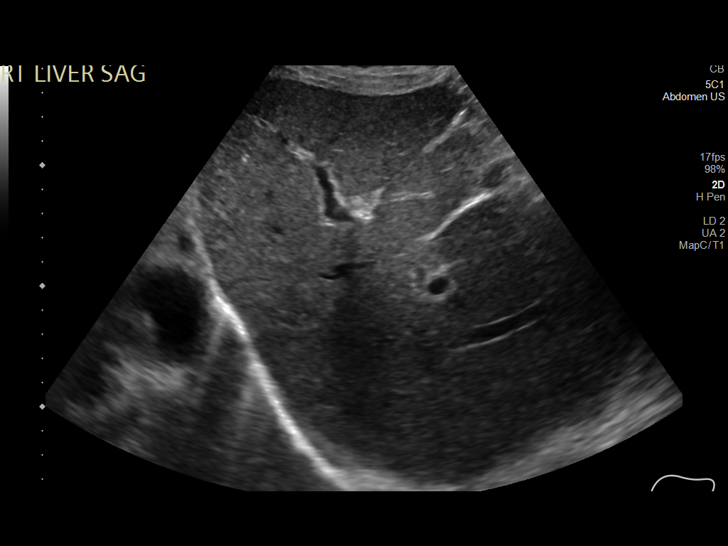
[im 70/77]
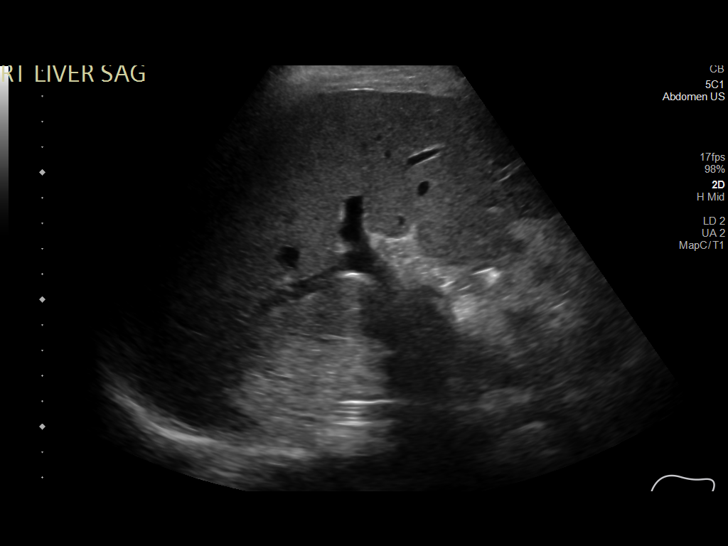
[im 77/77]
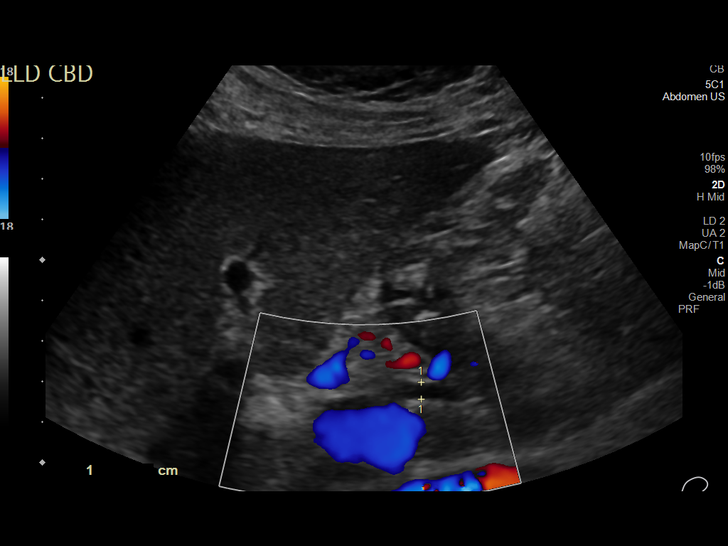

[14 of 25 positions shown; findings below may reference images not displayed]

FINDINGS: Gallbladder:

Numerous layering shadowing calculi are noted within the
gallbladder. The gallbladder, however, is not distended, there is no
gallbladder wall thickening, and no pericholecystic fluid is
identified. There are multiple echogenic foci identified within the
gallbladder wall demonstrating ring down artifact compatible with
changes of adenomyomatosis. More nodular echogenic focus within the
gallbladder fundus demonstrates ring down artifact on image # 18 and
also represents adenomyomatosis.

Common bile duct:

Diameter: 4 mm in mid diameter

Liver:

No focal lesion identified. Within normal limits in parenchymal
echogenicity. Portal vein is patent on color Doppler imaging with
normal direction of blood flow towards the liver.

Other: None.
IMPRESSION: Cholelithiasis without sonographic evidence of acute cholecystitis.

Gallbladder adenomyomatosis.

## 2020-06-20 MED ORDER — LIVING WELL WITH DIABETES BOOK - IN SPANISH
Freq: Once | Status: AC
  Filled 2020-06-20: qty 1

## 2020-06-20 MED ORDER — GADOBUTROL 1 MMOL/ML IV SOLN
7.5000 mL | Freq: Once | INTRAVENOUS | Status: AC | PRN
  Administered 2020-06-20: 7.5 mL via INTRAVENOUS

## 2020-06-20 NOTE — Progress Notes (Signed)
  Echocardiogram 2D Echocardiogram has been performed.  Joshua Nash 06/20/2020, 9:39 AM

## 2020-06-20 NOTE — Progress Notes (Signed)
Progress Note  Patient Name: Joshua Nash Date of Encounter: 06/20/2020  [facility] Cardiologist: Garwin Brothers, MD   Subjective   Denies any chest pain but continues to have shortness of breath  Inpatient Medications    Scheduled Meds: . amLODipine  10 mg Oral Daily  . cholestyramine  4 g Oral Daily  . enoxaparin (LOVENOX) injection  40 mg Subcutaneous Q24H  . insulin aspart  0-15 Units Subcutaneous TID WC  . insulin glargine  20 Units Subcutaneous QHS  . pantoprazole  20 mg Oral Daily   Continuous Infusions: . sodium chloride 125 mL/hr at 06/19/20 1757   PRN Meds: acetaminophen **OR** acetaminophen, camphor-menthol, nitroGLYCERIN   Vital Signs    Vitals:   06/19/20 1603 06/19/20 1628 06/19/20 1939 06/20/20 0410  BP:  (!) 157/99 123/79 (!) 143/88  Pulse:  98 99 100  Resp:  19 18 18   Temp: 98.4 F (36.9 C) 98.3 F (36.8 C) 98.3 F (36.8 C) 98.6 F (37 C)  TempSrc: Oral Oral Oral Oral  SpO2:  100% 99% 100%  Weight:    73.4 kg    Intake/Output Summary (Last 24 hours) at 06/20/2020 1112 Last data filed at 06/20/2020 0600 Gross per 24 hour  Intake 2913.3 ml  Output 1625 ml  Net 1288.3 ml   Last 3 Weights 06/20/2020 06/19/2020 06/01/2020  Weight (lbs) 161 lb 14.4 oz 162 lb 6.4 oz 167 lb 9.6 oz  Weight (kg) 73.437 kg 73.664 kg 76.023 kg      Telemetry    Normal sinus rhythm in the 90s- Personally Reviewed  ECG    No new EKG - Personally Reviewed  Physical Exam   GEN: No acute distress.   Neck: No JVD Cardiac: RRR, no murmurs, rubs, or gallops.  Respiratory: Clear to auscultation bilaterally. GI:  Epigastric TTP MS: No edema; No deformity. Neuro:  Nonfocal  Psych: Normal affect   Labs    High Sensitivity Troponin:   Recent Labs  Lab 06/19/20 1001 06/19/20 1150  TROPONINIHS 7 6      Chemistry Recent Labs  Lab 06/19/20 1001 06/19/20 1150 06/19/20 1823 06/20/20 0145  NA 129*  --  136 134*  K 6.4*  --  4.8 5.0  CL 100   --  106 109  CO2 21*  --  23 19*  GLUCOSE 516*  --  100* 155*  BUN 29*  --  25* 23*  CREATININE 1.90*  --  1.63* 1.82*  CALCIUM 9.5  --  10.1 9.2  PROT  --  7.3  --  6.2*  ALBUMIN  --  3.0*  --  2.5*  AST  --  57*  --  65*  ALT  --  90*  --  78*  ALKPHOS  --  777*  --  649*  BILITOT  --  1.0  --  0.9  GFRNONAA 42*  --  50* 44*  ANIONGAP 8  --  7 6     Hematology Recent Labs  Lab 06/19/20 1001  WBC 8.5  RBC 4.98  HGB 12.5*  HCT 40.4  MCV 81.1  MCH 25.1*  MCHC 30.9  RDW 17.4*  PLT 335    BNP Recent Labs  Lab 06/19/20 1823  BNP 13.6     DDimer No results for input(s): DDIMER in the last 168 hours.   Radiology    CT ABDOMEN PELVIS WO CONTRAST  Result Date: 06/19/2020 CLINICAL DATA:  Abdominal pain EXAM: CT ABDOMEN AND  PELVIS WITHOUT CONTRAST TECHNIQUE: Multidetector CT imaging of the abdomen and pelvis was performed following the standard protocol without IV contrast. COMPARISON:  None. FINDINGS: Lower chest: No acute abnormality. Hepatobiliary: Liver is within normal limits. Gallbladder demonstrates dense calcification within likely related to contraction and multiple small gallstones. No ductal dilatation is seen. Pancreas: Unremarkable. No pancreatic ductal dilatation or surrounding inflammatory changes. Spleen: Normal in size without focal abnormality. Adrenals/Urinary Tract: Adrenal glands are within normal limits bilaterally. Kidneys show a normal appearance with the exception of small nonobstructing lower pole left renal stone. Ureters are within normal limits bilaterally. No ureteral stones or obstructive changes are noted. The bladder is well distended. Stomach/Bowel: The appendix is within normal limits. No obstructive or inflammatory changes of the colon are seen. Small bowel and stomach appear within normal limits. Vascular/Lymphatic: Aortic atherosclerosis. No enlarged abdominal or pelvic lymph nodes. Reproductive: Prostate is unremarkable. Other: No abdominal  wall hernia or abnormality. No abdominopelvic ascites. Musculoskeletal: Sclerotic focus is noted in the left iliac wing likely related to a bone island. IMPRESSION: Dense calcification within the gallbladder likely related to decompression and multiple small stones within. No ductal dilatation is seen. Nonobstructing lower pole left renal stone. Normal-appearing appendix. Electronically Signed   By: Alcide Clever M.D.   On: 06/19/2020 12:47   DG Chest 2 View  Result Date: 06/19/2020 CLINICAL DATA:  Shortness of breath.  Recent chest pain EXAM: CHEST - 2 VIEW COMPARISON:  None. FINDINGS: Lungs are clear. Heart size and pulmonary vascularity are normal. No adenopathy. No pneumothorax. No bone lesions. IMPRESSION: Lungs clear.  Cardiac silhouette normal. Electronically Signed   By: Bretta Bang III M.D.   On: 06/19/2020 10:38   US Abdomen Limited RUQ (LIVER/GB)  Result Date: 06/20/2020 CLINICAL DATA:  Right upper quadrant abdominal pain EXAM: ULTRASOUND ABDOMEN LIMITED RIGHT UPPER QUADRANT COMPARISON:  None. FINDINGS: Gallbladder: Numerous layering shadowing calculi are noted within the gallbladder. The gallbladder, however, is not distended, there is no gallbladder wall thickening, and no pericholecystic fluid is identified. There are multiple echogenic foci identified within the gallbladder wall demonstrating ring down artifact compatible with changes of adenomyomatosis. More nodular echogenic focus within the gallbladder fundus demonstrates ring down artifact on image # 18 and also represents adenomyomatosis. Common bile duct: Diameter: 4 mm in mid diameter Liver: No focal lesion identified. Within normal limits in parenchymal echogenicity. Portal vein is patent on color Doppler imaging with normal direction of blood flow towards the liver. Other: None. IMPRESSION: Cholelithiasis without sonographic evidence of acute cholecystitis. Gallbladder adenomyomatosis. Electronically Signed   By: Helyn Numbers  MD   On: 06/20/2020 03:05    Cardiac Studies     Patient Profile     55 y.o. male with a hx of hypertension, diabetes mellitus, cardiac mass, obesity, left groin abscess s/p I&D at [facility] 03/19/2020 who is being seen today for the evaluation of chest pain and shortness of breath  Assessment & Plan    Exertional chest pain and shortness of breath: Symptoms concerning for stable angina.  Had scheduled outpatient coronary CT next month.  Troponin negative here.  Cardiac risk factor includes hypertension, uncontrolled diabetes and family history of CAD -Patient will need ischemic evaluation once stable renal function and GI issue.  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  TEE would be preferable.  Recommended starting with repeating transthoracic echocardiogram here for further evaluation. 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  Currently 1.8 -Avoid nephrotoxic agent  Abdominal pain: Elevated LFTs and lipase -Right upper quadrant ultrasound with cholelithiasis, no evidence of cholecystitis  Diabetes mellitus with hyperglycemia: Blood sugar severely elevated.  Given AKI hold empagliflozin and ACE -Management per primary team  Hypertension: Hold home lisinopril given renal function -Continue amlodipine  Hyperkalemia: resolved   For questions or updates, please contact [facility] Please consult [email or web address] for contact info under        Signed, Little Ishikawa, MD  06/20/2020, 11:12 AM

## 2020-06-20 NOTE — Progress Notes (Signed)
Inpatient Diabetes Program Recommendations  AACE/ADA: New Consensus Statement on Inpatient Glycemic Control (2015)  Target Ranges:  Prepandial:   less than 140 mg/dL      Peak postprandial:   less than 180 mg/dL (1-2 hours)      Critically ill patients:  140 - 180 mg/dL   Lab Results  Component Value Date   GLUCAP 157 (H) 06/20/2020   HGBA1C 12.8 (H) 06/19/2020    Review of Glycemic Control Results for Joshua Nash, Joshua Nash (MRN [id number]) as of 06/20/2020 12:13  Ref. Range 06/19/2020 16:25 06/19/2020 21:19 06/20/2020 06:18 06/20/2020 11:53  Glucose-Capillary Latest Ref Range: 70 - 99 mg/dL 416 (H) 606 (H) 301 (H) 157 (H)  Results for Joshua Nash, Joshua Nash (MRN [id number]) as of 06/20/2020 12:13  Ref. Range 06/19/2020 18:23  Hemoglobin A1C Latest Ref Range: 4.8 - 5.6 % 12.8 (H)   Diabetes history:  DM2 Outpatient Diabetes medications:  lantus 40 units QHS Jardiance 25 mg daily Current orders for Inpatient glycemic control:  Lantus 20 units daily Novolog 0-15 units TID  Spoke with patient at bedside using [employer], [phone].  Reviewed patient's current A1c of 12.8% (average blood sugar of 320 mg/dL over the past 2-3 months). Explained what a A1c is and what it measures. Also reviewed goal A1c with patient, importance of good glucose control @ home, and blood sugar goals.  He states he does not check his blood sugar at home because his meter has been broken.  Will ask MD for new prescription at [facility].  He uses [facility] for prescriptions.  He admits to drinking sweet tea, lemonade and occasional regular sodas.  Encouraged him to switch to beverages without sugar such as Crystal Light and maybe changing to half sweet tea and 1/2 un sweet tea.  Reviewed The Plate Method and importance of serving size and limiting CHO's such as beans, rice, pasta and potatoes.  He verbalizes understanding.  Explained how exercise can help decrease insulin resistance.  He administers his insulin in his  belly.  Educated on rotating sites for optimal insulin absorption.  Ordered the Living Well with Diabetes booklet in Spanish.  Explained short and long term complications of uncontrolled glucose.  He states he will check his blood sugar at least 2 x a day and bring meter with him to PCP follow up appointments for review.    MD at [facility] please prescribe a glucometer #[id number]  Will continue to follow while inpatient.  Thank you, Dulce Sellar, RN, BSN Diabetes Coordinator Inpatient Diabetes Program [phone] (team pager from 8a-5p)

## 2020-06-20 NOTE — Progress Notes (Signed)
Pt taken off of Telemetry for STAT MRI. Will report off to PM RN to expect return to the unit.

## 2020-06-20 NOTE — Consult Note (Addendum)
[facility] Consult: 2:42 PM 06/20/2020  LOS: 0 days    Referring Provider: Dr McDiarmid  Primary Care Physician:  Maryella Shivers, MD in [facility] Primary Gastroenterologist: unassigned  Spanish-speaking.  Needed interpreter for interview/assessment.  Reason for Consultation:  Elevated LFTs   HPI: Joshua Nash is a 55 y.o. male.  PMH DM 2 on Jardiance and insulin.  Hypertension.  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  I&D left groin abscess 03/19/2020.  Stage 3 CKD.  2nd bout w COVID-19 in early January 2022.  2 to 3 months of progressive DOE, chest tightness and associated nausea, abdominal pain, anorexia, fatigue.  Symptoms better at rest.  Was scheduled for outpatient coronary CT next month. GI symptoms specifically are epigastric pain which started after he had his second bout with Covid in January 2022.  He has had pruritus all over his body for about a month which interrupts his sleep, in the area of pruritus are nonbleeding eruptions/lesions but these are not pustules.  Several weeks of anorexia.  3 weeks of nausea but little to no vomiting.  Weight down 8 to 10 pounds.  Troponins negative. T bili 1.  Alk phos 777 >> 649.  AST/ALT 57/90 >> 65/78.  Lipase 79. GGT 1829. Acute hepatitis serologies including hep A IgM, hep B surface Ag, hep B core IgM, HCV Ab are negative.   Glucose 516.  Sodium 129 >> 134.  BUN/creatinine 29/1.9.   2 V CXR unremarkable CTAP w/o contrast: Dense gallbladder calcification, likely due to decompression and multiple small stones.  Ducts not dilated.  Liver normal.  Pancreas and pancreatic duct unremarkable.  Nonobstructing left renal stone.  Appendix normal.  Stomach, small intestine and colon unremarkable. Abdominal ultrasound limited: Cholelithiasis without  cholecystitis.  GB adenomyomatosis.  4 mm CBD.   Patient does not nor has he ever smoked tobacco, used illicit drugs or drink alcoholic beverages.  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 Family history of father's death at 52 with some sort of unspecified cardiac problem.  Both his parents drink alcohol heavily lately and had liver disease.  Past Medical History:  Diagnosis Date  . Abnormal electrocardiogram (ECG) (EKG)   . Angina pectoris ([place]) 06/01/2020  . Cardiac mass 06/01/2020  . COVID-19 03/18/2020  . Diabetes mellitus without complication ([place])   . Elevated liver function tests   . Hypertension   . Post-COVID chronic fatigue   . Tachycardia   . Type 2 diabetes mellitus with proteinuria [facility])     Past Surgical History:  Procedure Laterality Date  . LEG SURGERY  02/2020    Prior to Admission medications   Medication Sig Start Date End Date Taking? Authorizing Provider  amLODipine (NORVASC) 10 MG tablet Take 10 mg by mouth daily in the afternoon. 03/28/20 03/28/21 Yes [provider]  empagliflozin (JARDIANCE) 25 MG TABS tablet Take 25 mg by mouth daily.   Yes [provider]  hydrOXYzine (ATARAX/VISTARIL) 25 MG tablet Take 25 mg by mouth 3 (three) times daily as needed for  itching.   Yes [provider]  LANTUS SOLOSTAR 100 UNIT/ML Solostar Pen Inject 40 Units into the skin daily. 06/11/20  Yes [provider]  lisinopril (ZESTRIL) 10 MG tablet Take 10 mg by mouth every morning.   Yes [provider]  nitroGLYCERIN (NITROSTAT) 0.4 MG SL tablet Place 0.4 mg under the tongue every 5 (five) minutes as needed for chest pain.   Yes [provider]    Scheduled Meds: . amLODipine  10 mg Oral Daily  . cholestyramine  4 g Oral Daily  . enoxaparin (LOVENOX) injection  40 mg Subcutaneous Q24H  . insulin aspart  0-15 Units Subcutaneous TID WC  . insulin glargine  20 Units Subcutaneous QHS  .  pantoprazole  20 mg Oral Daily   Infusions: . sodium chloride 125 mL/hr at 06/19/20 1757   PRN Meds: acetaminophen **OR** acetaminophen, camphor-menthol, nitroGLYCERIN   Allergies as of 06/19/2020  . (No Known Allergies)    Family History  Problem Relation Age of Onset  . Diabetes Mother   . Hyperlipidemia Mother   . Hypertension Mother   . Diabetes Father   . Hyperlipidemia Father   . Hypertension Father     Social History   Socioeconomic History  . Marital status: Single    Spouse name: Not on file  . Number of children: Not on file  . Years of education: Not on file  . Highest education level: Not on file  Occupational History  . Not on file  Tobacco Use  . Smoking status: Never Smoker  . Smokeless tobacco: Never Used  Vaping Use  . Vaping Use: Never used  Substance and Sexual Activity  . Alcohol use: Yes    Comment: occ  . Drug use: Never  . Sexual activity: Not on file  Other Topics Concern  . Not on file  Social History Narrative  . Not on file   Social Determinants of Health   Financial Resource Strain: Not on file  Food Insecurity: Not on file  Transportation Needs: Not on file  Physical Activity: Not on file  Stress: Not on file  Social Connections: Not on file  Intimate Partner Violence: Not on file    REVIEW OF SYSTEMS: Constitutional: Fatigue. ENT:  No nose bleeds Pulm: Dyspnea on exertion. CV:  No palpitations, no LE edema.  Exertional chest pressure GU:  No hematuria, no frequency.  Urine is medium yellow-colored GI: See HPI Heme: No unusual or excessive bleeding or bruising Transfusions: None. Neuro:  No headaches, no peripheral tingling or numbness.  No syncope, no seizures Derm: Pruritus as per HPI.   Endocrine:  No sweats or chills.  No polyuria or dysuria Immunization: Received Pfizer COVID-19 vaccine x 3.      PHYSICAL EXAM: Vital signs in last 24 hours: Vitals:   06/19/20 1939 06/20/20 0410  BP: 123/79 (!) 143/88   Pulse: 99 100  Resp: 18 18  Temp: 98.3 F (36.8 C) 98.6 F (37 C)  SpO2: 99% 100%   Wt Readings from Last 3 Encounters:  06/20/20 73.4 kg  06/19/20 73.7 kg  06/01/20 76 kg    General: Looks moderately unwell, not acutely ill.   Head: No facial asymmetry or swelling.  No signs of head trauma. Eyes: No scleral icterus. Ears: Not hard of hearing Nose: No congestion or discharge. Mouth: Good teeth.  Mucosa is moist, pink, clear.  Tongue midline. Neck: No JVD, no masses, no thyromegaly Lungs: No dyspnea at rest or with  speaking.  Raspy vocal quality. Heart: RRR.  No MRG.  S1, S2 present Abdomen: Soft. Tenderness in RUQ, no guard or rebound.  Active bowel sounds..   Rectal: Deferred. Musc/Skeltl: No joint redness, swelling or gross deformity. Extremities: No CCE Neurologic: Oriented x3.  Fully alert.  Moves all 4 limbs.  No tremors, no gross deficits. Skin: Sallow coloration but no obvious jaundice.  Multiple small lesions on the trunk, arms, legs, head in areas where he says pruritus occurs.  No telangiectasia. Nodes: No cervical adenopathy Psych: Cooperative, calm, pleasant.  Intake/Output from previous day: 04/11 0701 - 04/12 0700 In: 2913.3 [P.O.:477; I.V.:1436.3; IV Piggyback:1000] Out: 1625 [Urine:1625] Intake/Output this shift: Total I/O In: -  Out: 750 [Urine:750]  LAB RESULTS: Recent Labs    06/19/20 1001  WBC 8.5  HGB 12.5*  HCT 40.4  PLT 335   BMET Lab Results  Component Value Date   NA 134 (L) 06/20/2020   NA 136 06/19/2020   NA 129 (L) 06/19/2020   K 5.0 06/20/2020   K 4.8 06/19/2020   K 6.4 (HH) 06/19/2020   CL 109 06/20/2020   CL 106 06/19/2020   CL 100 06/19/2020   CO2 19 (L) 06/20/2020   CO2 23 06/19/2020   CO2 21 (L) 06/19/2020   GLUCOSE 155 (H) 06/20/2020   GLUCOSE 100 (H) 06/19/2020   GLUCOSE 516 (HH) 06/19/2020   BUN 23 (H) 06/20/2020   BUN 25 (H) 06/19/2020   BUN 29 (H) 06/19/2020   CREATININE 1.82 (H) 06/20/2020   CREATININE  1.63 (H) 06/19/2020   CREATININE 1.90 (H) 06/19/2020   CALCIUM 9.2 06/20/2020   CALCIUM 10.1 06/19/2020   CALCIUM 9.5 06/19/2020   LFT Recent Labs    06/19/20 1150 06/20/20 0145  PROT 7.3 6.2*  ALBUMIN 3.0* 2.5*  AST 57* 65*  ALT 90* 78*  ALKPHOS 777* 649*  BILITOT 1.0 0.9  BILIDIR 0.3*  --   IBILI 0.7  --    PT/INR No results found for: INR, PROTIME Hepatitis Panel Recent Labs    06/19/20 1823  HEPBSAG NON REACTIVE  HCVAB NON REACTIVE  HEPAIGM NON REACTIVE  HEPBIGM NON REACTIVE   C-Diff No components found for: CDIFF Lipase     Component Value Date/Time   LIPASE 79 (H) 06/19/2020 1150    Drugs of Abuse  No results found for: LABOPIA, COCAINSCRNUR, LABBENZ, AMPHETMU, THCU, LABBARB   RADIOLOGY STUDIES: CT ABDOMEN PELVIS WO CONTRAST  Result Date: 06/19/2020 CLINICAL DATA:  Abdominal pain EXAM: CT ABDOMEN AND PELVIS WITHOUT CONTRAST TECHNIQUE: Multidetector CT imaging of the abdomen and pelvis was performed following the standard protocol without IV contrast. COMPARISON:  None. FINDINGS: Lower chest: No acute abnormality. Hepatobiliary: Liver is within normal limits. Gallbladder demonstrates dense calcification within likely related to contraction and multiple small gallstones. No ductal dilatation is seen. Pancreas: Unremarkable. No pancreatic ductal dilatation or surrounding inflammatory changes. Spleen: Normal in size without focal abnormality. Adrenals/Urinary Tract: Adrenal glands are within normal limits bilaterally. Kidneys show a normal appearance with the exception of small nonobstructing lower pole left renal stone. Ureters are within normal limits bilaterally. No ureteral stones or obstructive changes are noted. The bladder is well distended. Stomach/Bowel: The appendix is within normal limits. No obstructive or inflammatory changes of the colon are seen. Small bowel and stomach appear within normal limits. Vascular/Lymphatic: Aortic atherosclerosis. No enlarged  abdominal or pelvic lymph nodes. Reproductive: Prostate is unremarkable. Other: No abdominal wall hernia or abnormality. No abdominopelvic ascites. Musculoskeletal:  Sclerotic focus is noted in the left iliac wing likely related to a bone island. IMPRESSION: Dense calcification within the gallbladder likely related to decompression and multiple small stones within. No ductal dilatation is seen. Nonobstructing lower pole left renal stone. Normal-appearing appendix. Electronically Signed   By: Inez Catalina M.D.   On: 06/19/2020 12:47   DG Chest 2 View  Result Date: 06/19/2020 CLINICAL DATA:  Shortness of breath.  Recent chest pain EXAM: CHEST - 2 VIEW COMPARISON:  None. FINDINGS: Lungs are clear. Heart size and pulmonary vascularity are normal. No adenopathy. No pneumothorax. No bone lesions. IMPRESSION: Lungs clear.  Cardiac silhouette normal. Electronically Signed   By: Lowella Grip III M.D.   On: 06/19/2020 10:38   ECHOCARDIOGRAM COMPLETE  Result Date: 06/20/2020 IMPRESSIONS  1. Left ventricular ejection fraction, by estimation, is 50 to 55%. The left ventricle has low normal function. The left ventricle has no regional wall motion abnormalities. Left ventricular diastolic parameters are consistent with Grade I diastolic dysfunction (impaired relaxation).  2. Right ventricular systolic function is normal. The right ventricular size is normal. There is normal pulmonary artery systolic pressure.  3. The mitral valve is grossly normal. Trivial mitral valve regurgitation.  4. There is a thickened calcification on the LCC. No previous echo is available for review. . The aortic valve is grossly normal. Aortic valve regurgitation is not visualized. Electronically signed by Mertie Moores MD Signature Date/Time: 06/20/2020/12:09:11 PM    Final (Updated)    US Abdomen Limited RUQ (LIVER/GB)  Result Date: 06/20/2020 CLINICAL DATA:  Right upper quadrant abdominal pain EXAM: ULTRASOUND ABDOMEN LIMITED RIGHT  UPPER QUADRANT COMPARISON:  None. FINDINGS: Gallbladder: Numerous layering shadowing calculi are noted within the gallbladder. The gallbladder, however, is not distended, there is no gallbladder wall thickening, and no pericholecystic fluid is identified. There are multiple echogenic foci identified within the gallbladder wall demonstrating ring down artifact compatible with changes of adenomyomatosis. More nodular echogenic focus within the gallbladder fundus demonstrates ring down artifact on image # 18 and also represents adenomyomatosis. Common bile duct: Diameter: 4 mm in mid diameter Liver: No focal lesion identified. Within normal limits in parenchymal echogenicity. Portal vein is patent on color Doppler imaging with normal direction of blood flow towards the liver. Other: None. IMPRESSION: Cholelithiasis without sonographic evidence of acute cholecystitis. Gallbladder adenomyomatosis. Electronically Signed   By: Fidela Salisbury MD   On: 06/20/2020 03:05      IMPRESSION:   *   Elevated alk phos, lesser elevation transaminases.  Lipase mildly elevated.  Epigastric pain not severe, nausea, pruritus, anorexia.  Imaging thus far includes CT and ultrasound which show gallbladder stones w/o cholecystitis, normal bile duct.  Normal pancreas and pancreatic duct. R/o chronic cholecystitis.  R/o AIH.  R/o PBC.  R/o PSC (thoough normal liver on imaging)  *   AKI.  Improved.   *   IDDM.  Poorly controlled.  Glucose 516 >> 155.  Hgb A1c 12.8, c/w mean plasma glucose 320.  *   Hyponatremia.  Improved.  *   Exertional chest pain and dyspnea, concerning for stable angina.  Cardiac mass on outside hospital imaging.  Coronary CT was planned.  Exertional chest pain and dyspnea.  Echocardiogram with LVEF 50 to 55%.  Grade 1 diastolic dysfunction.  Normal pulmonary pressures.  Thickened calcification on LCC.  No significant valvular regurgitation. Cardiologist, Dr. Beatrix Fetters planning TEE.  Also  contemplating Lexiscan Myoview given renal dysfunction and cath only for high risk findings.  PLAN:     *    Check labs for autoimmune and metabolic causes of liver disease.  ANA,  AMA,  Smooth muscle antibody, ceruloplasmin, alpha-1 antitrypsin, IgG  *     ?  Pursue HIDA scan?    Azucena Freed  06/20/2020, 2:42 PM Phone [phone]

## 2020-06-20 NOTE — Plan of Care (Signed)
  Problem: Clinical Measurements: Goal: Respiratory complications will improve Outcome: Progressing   Problem: Coping: Goal: Level of anxiety will decrease Outcome: Progressing   Problem: Safety: Goal: Ability to remain free from injury will improve Outcome: Progressing   

## 2020-06-20 NOTE — Hospital Course (Addendum)
Joshua Nash is a 55 y.o. male presenting with chest pain, fatigue and hyperglycemia sent to the ED from cardiologist office for abnormal labs. PMH is significant for T2DM, HTN, transaminitis, CKD, post-Covid chronic fatigue, cardiac mass.   Chest pain Patient presented with intermittent anginal pain for the past 3 months being followed by outpatient cardiologist.  ACS work-up in the ED was negative for ACS, troponins were flat and no ischemic changes on EKG.  He had a pharmacologic stress test done during admission which was low risk.  Hyperkalemia K elevated to 6.4 on admission. EKG with borderline peaked T waves.  He was treated in the ED with IV fluids, insulin, beta agonist, and Lokelma. Repeat K+ improved to 4.8.  He had intermittent hyperkalemia during admission which resolved without treatment, potassium on day of discharge 5.1.  AKI superimposed on CKD stage IIIa Patient has had elevated creatinine with progressively elevation in the last 2 months. On admission Cr is 1.9 and was noted to be 1.6 two months ago. Baseline appears to be 1.5-1.7 though most records are not in our system, estimate he has CKD stage IIIa based creatinine values in our system. FeNa calculated at 1.7%, likely combination of prerenal and intrinsic process.  He was treated with IV fluids with improvement.  Lisinopril and empagliflozin were held during admission.  Creatinine on day of discharge 1.76.  T2DM Patient was started on 20 units of Lantus (half of his home Lantus dose) with sliding scale insulin.  Glucose levels were well controlled though spiked during the 24 hours prior to admission.  He was discharged on 30 units of Lantus.  Transaminitis Upon presentation with significant elevated ALP (777) with mild elevation in AST and ALT (57 and 90, respectively) and significant elevation in GGT (over 1800) suggestive of cholestatic picture. Total bilirubin within normal limits.  Lipase mildly elevated at 79.  CT  abdomen revealed gallstones without bile duct dilation. RUQ ultrasound showed adenomyomatosis of the gallbladder.  MRCP was obtained which was largely unremarkable other than cholelithiasis without cholecystitis, normal bile ducts, and hepatomegaly.  Autoimmune liver lab tests (including ANA, ceruloplasmin, mitochondrial antibodies, anti-smooth muscle antibodies, alpha-1 antitrypsin, IgG) were unremarkable.  Due to concern for PBC, liver biopsy was performed on 4/15, results to be followed up by GI.  ALP, AST, and ALT gradually improved throughout admission, ALP 499 on day of discharge with AST and ALT within normal limits.  Cardiac mass Per outpatient notes, he had a TTE which revealed an area of calcified mass on the aortic valve with unclear etiology.  Patient had a repeat TTE during admission which revealed some calcification of the aortic valve without mass.  No further work-up was recommended by cardiology.  Pruritis Patient was treated with as needed hydroxyzine, Sarna, and cholestyramine during admission.  Patient did not have significant improvement in pruritus.  Bile acids were obtained which were pending at time of discharge.  DC recommendations:  Screening colonoscopy. Patient found to have mild anemia with elevated ferritin. Denies hematuria, melena, hematochezia. Never had colonoscopy before.  Lisinopril discontinued, consider restarting as appropriate if normal renal function and potassium. Consider starting statin if liver enzymes improve. Confirm cardiology follow up has been scheduled.  Repeat BMP to monitor kidney function and potassium. Consider ursodiol if bile acids elevated.

## 2020-06-20 NOTE — Progress Notes (Signed)
Family Medicine Teaching Service Daily Progress Note Intern Pager: [phone]  Patient name: Joshua Nash Medical record number: [id number] Date of birth: [birth year] Age: 55 y.o. Gender: male  Primary Care Provider: Charlott RakesHodges, [place], MD Consultants: cardiology, GI   Code Status: Full Code  Pt Overview and Major Events to Date:  4/11 admitted  Assessment and Plan: Joshua Nash is a 55 y.o. male who presents with hyperkalemia, AKI, transaminitis, and anginal chest pain. PMHx significant for . T2DM, HTN, transaminitis, CKD, post-Covid chronic fatigue, cardiac mass.  Angina pectoris Likely stable angina based on symptoms.  No chest pain this morning. - f/u TTE - cardiology consulted, appreciate recommendations  Hyperkalemia Resolved s/p multiple treatments.  AKI superimposed on CKD stage IIIa Slightly improved with IV fluids, baseline around 1.5-1.7.  Creatinine 1.82 this morning.  FeNa calculated at 1.7%, not clearly prerenal or intrinsic but possibly combination of both.  Significant proteinuria, will assess for nephrotic syndrome. - urine protein/creatinine ratio - continue mIVF NS - holding lisinopril - avoid nephrotoxic agents - monitor on BMP  T2DM Glucose control improved, adequately controlled on half of his home insulin regimen.  This suggests some possible nonadherence.  Fasting glucose was 132.  He received 13 units of short acting insulin since yesterday.  A1c this admission 12.8%, poorly controlled. - Lantus 20 units daily - moderate SSI - monitor CBG - hold empagliflozin  HTN Mildly hypertensive with systolic in the 140s. - continue amlodipine 10 mg - holding lisinopril given AKI  HLD Significantly elevated with total cholesterol 512 and direct LDL 281.  Triglycerides 434.  Will consider secondary causes such as nephrotic syndrome.  Holding off on statin at this time due to transaminitis.  Transaminitis Cholestatic transaminitis with  significant elevated ALP and mild elevation in AST and ALT.  Elevation in ALP appears to be chronic.  GGT elevated at 1800.  Gallstones seen on CT without gallbladder wall dilation.  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  No evidence of acute cholecystitis on imaging.  Viral hepatitis panel negative. - monitor on CMP - GI consulted, appreciated recommendations  Cardiac mass Repeating TTE, further work-up pending results. - cardiology following, appreciate recommendations  Normocytic anemia Mild.  Ferritin elevated which suggest anemia of chronic inflammation.  B12 and folate levels normal. - recommending screening colonoscopy on f/u  Pruritis Unclear etiology, no significant elevation in bilirubin to explain pruritus. - hydroxyzine TID prn - Sarna cream - cholestyramine  FEN/GI: carb-modified diet PPx: LMWH  Disposition: med-tele  Subjective:  NAOE.  Patient feels good at this time, denies any chest pain.  Objective: Temp:  [98.1 F (36.7 C)-98.6 F (37 C)] 98.6 F (37 C) (04/12 0410) Pulse Rate:  [89-100] 100 (04/12 0410) Resp:  [12-20] 18 (04/12 0410) BP: (114-157)/(79-100) 143/88 (04/12 0410) SpO2:  [99 %-100 %] 100 % (04/12 0410) Weight:  [73.4 kg-73.7 kg] 73.4 kg (04/12 0410) Physical Exam: General: Overweight male laying in bed comfortably, NAD Cardiovascular: RRR, no murmurs Respiratory: CTA B, no respiratory distress Abdomen: Soft, mild tenderness to epigastric and LUQ region, positive bowel sounds Extremities: WWP, no edema  Laboratory: Recent Labs  Lab 06/19/20 1001  WBC 8.5  HGB 12.5*  HCT 40.4  PLT 335   Recent Labs  Lab 06/19/20 1001 06/19/20 1150 06/19/20 1823 06/20/20 0145  NA 129*  --  136 134*  K 6.4*  --  4.8 5.0  CL 100  --  106 109  CO2 21*  --  23 19*  BUN 29*  --  25* 23*  CREATININE 1.90*  --  1.63* 1.82*  CALCIUM  9.5  --  10.1 9.2  PROT  --  7.3  --  6.2*  BILITOT  --  1.0  --  0.9  ALKPHOS  --  777*  --  649*  ALT  --  90*  --  78*  AST  --  57*  --  65*  GLUCOSE 516*  --  100* 155*     Imaging/Diagnostic Tests: ECHOCARDIOGRAM COMPLETE  Result Date: 06/20/2020    ECHOCARDIOGRAM REPORT   Patient Name:   WESLY WHISENANT Date of Exam: 06/20/2020 Medical Rec #:  [id number]             Height:       65.0 in Accession #:    [id number]            Weight:       161.9 lb Date of Birth:  25-Sep-1965             BSA:          1.808 m Patient Age:    54 years              BP:           143/88 mmHg Patient Gender: M                     HR:           91 bpm. Exam Location:  Inpatient Procedure: 2D Echo Indications:     Chest Pain R07.9  History:         Patient has no prior history of Echocardiogram examinations.                  Known Aortic Valve Cardiac mass; Risk Factors:Diabetes and                  Hypertension.  Sonographer:     Thurman Coyer RDCS (AE) Referring Phys:  [id number] Manson Passey Diagnosing Phys: Kristeen Miss MD IMPRESSIONS  1. Left ventricular ejection fraction, by estimation, is 50 to 55%. The left ventricle has low normal function. The left ventricle has no regional wall motion abnormalities. Left ventricular diastolic parameters are consistent with Grade I diastolic dysfunction (impaired relaxation).  2. Right ventricular systolic function is normal. The right ventricular size is normal. There is normal pulmonary artery systolic pressure.  3. The mitral valve is grossly normal. Trivial mitral valve regurgitation.  4. There is a thickened calcification on the LCC. No previous echo is available for review. . The aortic valve is grossly normal. Aortic valve regurgitation is not visualized. FINDINGS  Left Ventricle: Left ventricular ejection fraction, by estimation, is 50 to 55%. The left ventricle has low normal function. The left ventricle has no regional wall motion abnormalities. The left  ventricular internal cavity size was normal in size. There is no left ventricular hypertrophy. Left ventricular diastolic parameters are consistent with Grade I diastolic dysfunction (impaired relaxation). Right Ventricle: The right ventricular size is normal. No increase in right ventricular wall thickness. Right ventricular systolic function is normal. There is normal pulmonary artery systolic pressure. The tricuspid regurgitant velocity is 2.20 m/s, and  with an assumed right atrial pressure of 3 mmHg, the estimated right ventricular systolic pressure is 22.4 mmHg. Left Atrium: Left atrial size was  normal in size. Right Atrium: Right atrial size was normal in size. Pericardium: There is no evidence of pericardial effusion. Mitral Valve: The mitral valve is grossly normal. Trivial mitral valve regurgitation. Tricuspid Valve: The tricuspid valve is grossly normal. Tricuspid valve regurgitation is trivial. Aortic Valve: There is a thickened calcification on the LCC. No previous echo is available for review. The aortic valve is grossly normal. Aortic valve regurgitation is not visualized. Pulmonic Valve: The pulmonic valve was normal in structure. Pulmonic valve regurgitation is not visualized. Aorta: The aortic root and ascending aorta are structurally normal, with no evidence of dilitation. IAS/Shunts: The atrial septum is grossly normal.  LEFT VENTRICLE PLAX 2D LVIDd:         4.20 cm  Diastology LVIDs:         2.80 cm  LV e' medial:    4.22 cm/s LV PW:         1.10 cm  LV E/e' medial:  16.5 LV IVS:        1.00 cm  LV e' lateral:   7.70 cm/s LVOT diam:     2.00 cm  LV E/e' lateral: 9.1 LV SV:         46 LV SV Index:   25 LVOT Area:     3.14 cm  RIGHT VENTRICLE RV S prime:     12.90 cm/s TAPSE (M-mode): 2.0 cm LEFT ATRIUM           Index       RIGHT ATRIUM           Index LA diam:      3.50 cm 1.94 cm/m  RA Area:     13.10 cm LA Vol (A2C): 45.8 ml 25.33 ml/m RA Volume:   28.50 ml  15.76 ml/m LA Vol (A4C): 43.6  ml 24.11 ml/m  AORTIC VALVE LVOT Vmax:   99.00 cm/s LVOT Vmean:  54.900 cm/s LVOT VTI:    0.146 m  AORTA Ao Root diam: 3.30 cm Ao Asc diam:  3.40 cm MITRAL VALVE               TRICUSPID VALVE MV Area (PHT): 3.97 cm    TR Peak grad:   19.4 mmHg MV Decel Time: 191 msec    TR Vmax:        220.00 cm/s MV E velocity: 69.80 cm/s MV A velocity: 91.70 cm/s  SHUNTS MV E/A ratio:  0.76        Systemic VTI:  0.15 m                            Systemic Diam: 2.00 cm Kristeen Miss MD Electronically signed by Kristeen Miss MD Signature Date/Time: 06/20/2020/12:09:11 PM    Final (Updated)    US Abdomen Limited RUQ (LIVER/GB)  Result Date: 06/20/2020 CLINICAL DATA:  Right upper quadrant abdominal pain EXAM: ULTRASOUND ABDOMEN LIMITED RIGHT UPPER QUADRANT COMPARISON:  None. FINDINGS: Gallbladder: Numerous layering shadowing calculi are noted within the gallbladder. The gallbladder, however, is not distended, there is no gallbladder wall thickening, and no pericholecystic fluid is identified. There are multiple echogenic foci identified within the gallbladder wall demonstrating ring down artifact compatible with changes of adenomyomatosis. More nodular echogenic focus within the gallbladder fundus demonstrates ring down artifact on image # 18 and also represents adenomyomatosis. Common bile duct: Diameter: 4 mm in mid diameter Liver: No focal lesion identified. Within normal limits in parenchymal echogenicity. Portal vein  is patent on color Doppler imaging with normal direction of blood flow towards the liver. Other: None. IMPRESSION: Cholelithiasis without sonographic evidence of acute cholecystitis. Gallbladder adenomyomatosis. Electronically Signed   By: Helyn Numbers MD   On: 06/20/2020 03:05     Littie Deeds, MD 06/20/2020, 7:31 AM PGY-1, [facility] Family Medicine FPTS Intern pager: [phone], text pages welcome

## 2020-06-21 ENCOUNTER — Observation Stay (HOSPITAL_COMMUNITY): Payer: Medicaid Other

## 2020-06-21 DIAGNOSIS — Z833 Family history of diabetes mellitus: Secondary | ICD-10-CM | POA: Diagnosis not present

## 2020-06-21 DIAGNOSIS — K8020 Calculus of gallbladder without cholecystitis without obstruction: Secondary | ICD-10-CM | POA: Diagnosis present

## 2020-06-21 DIAGNOSIS — Z794 Long term (current) use of insulin: Secondary | ICD-10-CM | POA: Diagnosis not present

## 2020-06-21 DIAGNOSIS — R079 Chest pain, unspecified: Secondary | ICD-10-CM | POA: Diagnosis not present

## 2020-06-21 DIAGNOSIS — M7989 Other specified soft tissue disorders: Secondary | ICD-10-CM

## 2020-06-21 DIAGNOSIS — I200 Unstable angina: Secondary | ICD-10-CM | POA: Diagnosis present

## 2020-06-21 DIAGNOSIS — R160 Hepatomegaly, not elsewhere classified: Secondary | ICD-10-CM | POA: Diagnosis present

## 2020-06-21 DIAGNOSIS — E1165 Type 2 diabetes mellitus with hyperglycemia: Secondary | ICD-10-CM | POA: Diagnosis present

## 2020-06-21 DIAGNOSIS — N179 Acute kidney failure, unspecified: Secondary | ICD-10-CM | POA: Diagnosis present

## 2020-06-21 DIAGNOSIS — I209 Angina pectoris, unspecified: Secondary | ICD-10-CM | POA: Diagnosis not present

## 2020-06-21 DIAGNOSIS — R0600 Dyspnea, unspecified: Secondary | ICD-10-CM | POA: Diagnosis not present

## 2020-06-21 DIAGNOSIS — U099 Post covid-19 condition, unspecified: Secondary | ICD-10-CM | POA: Diagnosis present

## 2020-06-21 DIAGNOSIS — D649 Anemia, unspecified: Secondary | ICD-10-CM | POA: Diagnosis present

## 2020-06-21 DIAGNOSIS — Z79899 Other long term (current) drug therapy: Secondary | ICD-10-CM | POA: Diagnosis not present

## 2020-06-21 DIAGNOSIS — N183 Chronic kidney disease, stage 3 unspecified: Secondary | ICD-10-CM | POA: Diagnosis not present

## 2020-06-21 DIAGNOSIS — E782 Mixed hyperlipidemia: Secondary | ICD-10-CM | POA: Diagnosis present

## 2020-06-21 DIAGNOSIS — E860 Dehydration: Secondary | ICD-10-CM | POA: Diagnosis present

## 2020-06-21 DIAGNOSIS — E872 Acidosis: Secondary | ICD-10-CM | POA: Diagnosis present

## 2020-06-21 DIAGNOSIS — E663 Overweight: Secondary | ICD-10-CM | POA: Diagnosis present

## 2020-06-21 DIAGNOSIS — R945 Abnormal results of liver function studies: Secondary | ICD-10-CM | POA: Diagnosis not present

## 2020-06-21 DIAGNOSIS — K5900 Constipation, unspecified: Secondary | ICD-10-CM | POA: Diagnosis present

## 2020-06-21 DIAGNOSIS — R5382 Chronic fatigue, unspecified: Secondary | ICD-10-CM | POA: Diagnosis present

## 2020-06-21 DIAGNOSIS — E875 Hyperkalemia: Secondary | ICD-10-CM | POA: Diagnosis present

## 2020-06-21 DIAGNOSIS — Z20822 Contact with and (suspected) exposure to covid-19: Secondary | ICD-10-CM | POA: Diagnosis present

## 2020-06-21 DIAGNOSIS — R7989 Other specified abnormal findings of blood chemistry: Secondary | ICD-10-CM | POA: Diagnosis not present

## 2020-06-21 DIAGNOSIS — E8809 Other disorders of plasma-protein metabolism, not elsewhere classified: Secondary | ICD-10-CM | POA: Diagnosis present

## 2020-06-21 DIAGNOSIS — R81 Glycosuria: Secondary | ICD-10-CM | POA: Diagnosis present

## 2020-06-21 DIAGNOSIS — L299 Pruritus, unspecified: Secondary | ICD-10-CM | POA: Diagnosis present

## 2020-06-21 DIAGNOSIS — E1122 Type 2 diabetes mellitus with diabetic chronic kidney disease: Secondary | ICD-10-CM | POA: Diagnosis present

## 2020-06-21 DIAGNOSIS — N200 Calculus of kidney: Secondary | ICD-10-CM | POA: Diagnosis present

## 2020-06-21 DIAGNOSIS — I5189 Other ill-defined heart diseases: Secondary | ICD-10-CM | POA: Diagnosis not present

## 2020-06-21 DIAGNOSIS — I152 Hypertension secondary to endocrine disorders: Secondary | ICD-10-CM | POA: Diagnosis present

## 2020-06-21 DIAGNOSIS — R748 Abnormal levels of other serum enzymes: Secondary | ICD-10-CM | POA: Diagnosis not present

## 2020-06-21 DIAGNOSIS — N1832 Chronic kidney disease, stage 3b: Secondary | ICD-10-CM | POA: Diagnosis present

## 2020-06-21 HISTORY — DX: Acute kidney failure, unspecified: N17.9

## 2020-06-21 HISTORY — DX: Hepatomegaly, not elsewhere classified: R16.0

## 2020-06-21 LAB — GLUCOSE, CAPILLARY
Glucose-Capillary: 134 mg/dL — ABNORMAL HIGH (ref 70–99)
Glucose-Capillary: 257 mg/dL — ABNORMAL HIGH (ref 70–99)
Glucose-Capillary: 157 mg/dL — ABNORMAL HIGH (ref 70–99)
Glucose-Capillary: 174 mg/dL — ABNORMAL HIGH (ref 70–99)

## 2020-06-21 LAB — COMPREHENSIVE METABOLIC PANEL
ALT: 58 U/L — ABNORMAL HIGH (ref 0–44)
AST: 34 U/L (ref 15–41)
Albumin: 2.6 g/dL — ABNORMAL LOW (ref 3.5–5.0)
Alkaline Phosphatase: 601 U/L — ABNORMAL HIGH (ref 38–126)
Anion gap: 4 — ABNORMAL LOW (ref 5–15)
BUN: 21 mg/dL — ABNORMAL HIGH (ref 6–20)
CO2: 20 mmol/L — ABNORMAL LOW (ref 22–32)
Calcium: 9.2 mg/dL (ref 8.9–10.3)
Chloride: 112 mmol/L — ABNORMAL HIGH (ref 98–111)
Creatinine, Ser: 1.52 mg/dL — ABNORMAL HIGH (ref 0.61–1.24)
GFR, Estimated: 55 mL/min — ABNORMAL LOW (ref >60)
Glucose, Bld: 136 mg/dL — ABNORMAL HIGH (ref 70–99)
Potassium: 4.7 mmol/L (ref 3.5–5.1)
Sodium: 136 mmol/L (ref 135–145)
Total Bilirubin: 0.9 mg/dL (ref 0.3–1.2)
Total Protein: 6.3 g/dL — ABNORMAL LOW (ref 6.5–8.1)

## 2020-06-21 LAB — CBC
HCT: 37.4 % — ABNORMAL LOW (ref 39.0–52.0)
Hemoglobin: 11.7 g/dL — ABNORMAL LOW (ref 13.0–17.0)
MCH: 24.9 pg — ABNORMAL LOW (ref 26.0–34.0)
MCHC: 31.3 g/dL (ref 30.0–36.0)
MCV: 79.6 fL — ABNORMAL LOW (ref 80.0–100.0)
Platelets: 299 10*3/uL (ref 150–400)
RBC: 4.7 MIL/uL (ref 4.22–5.81)
RDW: 17.2 % — ABNORMAL HIGH (ref 11.5–15.5)
WBC: 8.2 10*3/uL (ref 4.0–10.5)
nRBC: 0 % (ref 0.0–0.2)

## 2020-06-21 LAB — PROTIME-INR
INR: 0.9 (ref 0.8–1.2)
Prothrombin Time: 11.8 seconds (ref 11.4–15.2)

## 2020-06-21 LAB — PREALBUMIN: Prealbumin: 23.9 mg/dL (ref 18–38)

## 2020-06-21 LAB — SEDIMENTATION RATE: Sed Rate: 46 mm/hr — ABNORMAL HIGH (ref 0–16)

## 2020-06-21 MED ORDER — CAMPHOR-MENTHOL 0.5-0.5 % EX LOTN
TOPICAL_LOTION | Freq: Two times a day (BID) | CUTANEOUS | Status: DC
  Filled 2020-06-21: qty 222

## 2020-06-21 MED ORDER — HYDROXYZINE HCL 25 MG PO TABS
25.0000 mg | ORAL_TABLET | Freq: Three times a day (TID) | ORAL | Status: DC | PRN
  Administered 2020-06-22 (×2): 25 mg via ORAL
  Filled 2020-06-21 (×2): qty 1

## 2020-06-21 MED ORDER — CHOLESTYRAMINE 4 G PO PACK
4.0000 g | PACK | Freq: Two times a day (BID) | ORAL | Status: DC
  Administered 2020-06-21 – 2020-06-22 (×4): 4 g via ORAL
  Filled 2020-06-21 (×6): qty 1

## 2020-06-21 NOTE — Progress Notes (Signed)
Progress Note  Patient Name: Joshua Nash Date of Encounter: 06/21/2020  [facility] Cardiologist: Garwin Brothers, MD   Subjective   Reports some shortness of breath  Inpatient Medications    Scheduled Meds: . amLODipine  10 mg Oral Daily  . camphor-menthol   Topical BID  . cholestyramine  4 g Oral BID  . enoxaparin (LOVENOX) injection  40 mg Subcutaneous Q24H  . insulin aspart  0-15 Units Subcutaneous TID WC  . insulin glargine  20 Units Subcutaneous QHS  . pantoprazole  20 mg Oral Daily   Continuous Infusions:  PRN Meds: acetaminophen **OR** acetaminophen, hydrOXYzine, nitroGLYCERIN   Vital Signs    Vitals:   06/20/20 1641 06/20/20 2009 06/21/20 0500 06/21/20 0827  BP: 138/81 133/81 128/75 139/71  Pulse: 95 94 90 93  Resp:  16    Temp: 99.4 F (37.4 C) 98.2 F (36.8 C) 98.2 F (36.8 C) 98.4 F (36.9 C)  TempSrc: Oral Oral Oral Oral  SpO2: 97% 97% 99% 100%  Weight:   73.7 kg     Intake/Output Summary (Last 24 hours) at 06/21/2020 1103 Last data filed at 06/21/2020 0828 Gross per 24 hour  Intake 2331.08 ml  Output 2450 ml  Net -118.92 ml   Last 3 Weights 06/21/2020 06/20/2020 06/19/2020  Weight (lbs) 162 lb 7.7 oz 161 lb 14.4 oz 162 lb 6.4 oz  Weight (kg) 73.7 kg 73.437 kg 73.664 kg      Telemetry    Normal sinus rhythm in the 90s- Personally Reviewed  ECG    No new EKG - Personally Reviewed  Physical Exam   GEN: No acute distress.   Neck: No JVD Cardiac: RRR, no murmurs, rubs, or gallops.  Respiratory: Clear to auscultation bilaterally. GI:  Epigastric TTP MS: No edema; No deformity. Neuro:  Nonfocal  Psych: Normal affect   Labs    High Sensitivity Troponin:   Recent Labs  Lab 06/19/20 1001 06/19/20 1150  TROPONINIHS 7 6      Chemistry Recent Labs  Lab 06/19/20 1150 06/19/20 1823 06/20/20 0145 06/21/20 0610  NA  --  136 134* 136  K  --  4.8 5.0 4.7  CL  --  106 109 112*  CO2  --  23 19* 20*  GLUCOSE  --  100*  155* 136*  BUN  --  25* 23* 21*  CREATININE  --  1.63* 1.82* 1.52*  CALCIUM  --  10.1 9.2 9.2  PROT 7.3  --  6.2* 6.3*  ALBUMIN 3.0*  --  2.5* 2.6*  AST 57*  --  65* 34  ALT 90*  --  78* 58*  ALKPHOS 777*  --  649* 601*  BILITOT 1.0  --  0.9 0.9  GFRNONAA  --  50* 44* 55*  ANIONGAP  --  7 6 4*     Hematology Recent Labs  Lab 06/19/20 1001 06/21/20 0610  WBC 8.5 8.2  RBC 4.98 4.70  HGB 12.5* 11.7*  HCT 40.4 37.4*  MCV 81.1 79.6*  MCH 25.1* 24.9*  MCHC 30.9 31.3  RDW 17.4* 17.2*  PLT 335 299    BNP Recent Labs  Lab 06/19/20 1823  BNP 13.6     DDimer No results for input(s): DDIMER in the last 168 hours.   Radiology    CT ABDOMEN PELVIS WO CONTRAST  Result Date: 06/19/2020 CLINICAL DATA:  Abdominal pain EXAM: CT ABDOMEN AND PELVIS WITHOUT CONTRAST TECHNIQUE: Multidetector CT imaging of the abdomen and  pelvis was performed following the standard protocol without IV contrast. COMPARISON:  None. FINDINGS: Lower chest: No acute abnormality. Hepatobiliary: Liver is within normal limits. Gallbladder demonstrates dense calcification within likely related to contraction and multiple small gallstones. No ductal dilatation is seen. Pancreas: Unremarkable. No pancreatic ductal dilatation or surrounding inflammatory changes. Spleen: Normal in size without focal abnormality. Adrenals/Urinary Tract: Adrenal glands are within normal limits bilaterally. Kidneys show a normal appearance with the exception of small nonobstructing lower pole left renal stone. Ureters are within normal limits bilaterally. No ureteral stones or obstructive changes are noted. The bladder is well distended. Stomach/Bowel: The appendix is within normal limits. No obstructive or inflammatory changes of the colon are seen. Small bowel and stomach appear within normal limits. Vascular/Lymphatic: Aortic atherosclerosis. No enlarged abdominal or pelvic lymph nodes. Reproductive: Prostate is unremarkable. Other: No  abdominal wall hernia or abnormality. No abdominopelvic ascites. Musculoskeletal: Sclerotic focus is noted in the left iliac wing likely related to a bone island. IMPRESSION: Dense calcification within the gallbladder likely related to decompression and multiple small stones within. No ductal dilatation is seen. Nonobstructing lower pole left renal stone. Normal-appearing appendix. Electronically Signed   By: Alcide Clever M.D.   On: 06/19/2020 12:47   MR ABDOMEN MRCP W WO CONTAST  Result Date: 06/20/2020 CLINICAL DATA:  Anorexia. Right upper quadrant pain. Nausea. Malaise. Gallstones. 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 EXAM: MRI ABDOMEN WITHOUT AND WITH CONTRAST (INCLUDING MRCP) TECHNIQUE: Multiplanar multisequence MR imaging of the abdomen was performed both before and after the administration of intravenous contrast. Heavily T2-weighted images of the biliary and pancreatic ducts were obtained, and three-dimensional MRCP images were rendered by post processing. CONTRAST:  7.33mL GADAVIST GADOBUTROL 1 MMOL/ML IV SOLN COMPARISON:  06/20/2020 ultrasound and 06/19/2020 CT. FINDINGS: Portions of exam are mildly motion degraded. Lower chest: Normal heart size without pericardial or pleural effusion. Hepatobiliary: Hepatomegaly, 19.4 cm craniocaudal. No steatosis. No cirrhosis. No focal liver lesion. Multiple small gallstones. No acute cholecystitis or biliary duct dilatation. No choledocholithiasis. Pancreas:  Normal, without mass or ductal dilatation. Spleen:  Normal in size, without focal abnormality. Adrenals/Urinary Tract: Normal adrenal glands. Tiny bilateral renal cysts. No hydronephrosis. Stomach/Bowel: Proximal gastric underdistention. Normal abdominal small bowel loops. Colonic stool burden suggests constipation. Vascular/Lymphatic: Aortic atherosclerosis. No retroperitoneal or retrocrural adenopathy. Other:  No ascites. Musculoskeletal: No acute osseous abnormality. IMPRESSION: 1. Mildly motion  degraded exam. 2. Cholelithiasis without acute cholecystitis or biliary duct dilatation. 3. Hepatomegaly. 4. Possible constipation. Electronically Signed   By: Jeronimo Greaves M.D.   On: 06/20/2020 20:16   ECHOCARDIOGRAM COMPLETE  Result Date: 06/20/2020    ECHOCARDIOGRAM REPORT   Patient Name:   KALIF KATTNER Date of Exam: 06/20/2020 Medical Rec #:  [id number]             Height:       65.0 in Accession #:    [id number]            Weight:       161.9 lb Date of Birth:  1966/02/16             BSA:          1.808 m Patient Age:    54 years              BP:           143/88 mmHg Patient Gender: M  HR:           91 bpm. Exam Location:  Inpatient Procedure: 2D Echo Indications:     Chest Pain R07.9  History:         Patient has no prior history of Echocardiogram examinations.                  Known Aortic Valve Cardiac mass; Risk Factors:Diabetes and                  Hypertension.  Sonographer:     Thurman Coyer RDCS (AE) Referring Phys:  [id number] Manson Passey Diagnosing Phys: Kristeen Miss MD IMPRESSIONS  1. Left ventricular ejection fraction, by estimation, is 50 to 55%. The left ventricle has low normal function. The left ventricle has no regional wall motion abnormalities. Left ventricular diastolic parameters are consistent with Grade I diastolic dysfunction (impaired relaxation).  2. Right ventricular systolic function is normal. The right ventricular size is normal. There is normal pulmonary artery systolic pressure.  3. The mitral valve is grossly normal. Trivial mitral valve regurgitation.  4. There is a thickened calcification on the LCC. No previous echo is available for review. . The aortic valve is grossly normal. Aortic valve regurgitation is not visualized. FINDINGS  Left Ventricle: Left ventricular ejection fraction, by estimation, is 50 to 55%. The left ventricle has low normal function. The left ventricle has no regional wall motion abnormalities. The left ventricular  internal cavity size was normal in size. There is no left ventricular hypertrophy. Left ventricular diastolic parameters are consistent with Grade I diastolic dysfunction (impaired relaxation). Right Ventricle: The right ventricular size is normal. No increase in right ventricular wall thickness. Right ventricular systolic function is normal. There is normal pulmonary artery systolic pressure. The tricuspid regurgitant velocity is 2.20 m/s, and  with an assumed right atrial pressure of 3 mmHg, the estimated right ventricular systolic pressure is 22.4 mmHg. Left Atrium: Left atrial size was normal in size. Right Atrium: Right atrial size was normal in size. Pericardium: There is no evidence of pericardial effusion. Mitral Valve: The mitral valve is grossly normal. Trivial mitral valve regurgitation. Tricuspid Valve: The tricuspid valve is grossly normal. Tricuspid valve regurgitation is trivial. Aortic Valve: There is a thickened calcification on the LCC. No previous echo is available for review. The aortic valve is grossly normal. Aortic valve regurgitation is not visualized. Pulmonic Valve: The pulmonic valve was normal in structure. Pulmonic valve regurgitation is not visualized. Aorta: The aortic root and ascending aorta are structurally normal, with no evidence of dilitation. IAS/Shunts: The atrial septum is grossly normal.  LEFT VENTRICLE PLAX 2D LVIDd:         4.20 cm  Diastology LVIDs:         2.80 cm  LV e' medial:    4.22 cm/s LV PW:         1.10 cm  LV E/e' medial:  16.5 LV IVS:        1.00 cm  LV e' lateral:   7.70 cm/s LVOT diam:     2.00 cm  LV E/e' lateral: 9.1 LV SV:         46 LV SV Index:   25 LVOT Area:     3.14 cm  RIGHT VENTRICLE RV S prime:     12.90 cm/s TAPSE (M-mode): 2.0 cm LEFT ATRIUM           Index       RIGHT ATRIUM  Index LA diam:      3.50 cm 1.94 cm/m  RA Area:     13.10 cm LA Vol (A2C): 45.8 ml 25.33 ml/m RA Volume:   28.50 ml  15.76 ml/m LA Vol (A4C): 43.6 ml 24.11  ml/m  AORTIC VALVE LVOT Vmax:   99.00 cm/s LVOT Vmean:  54.900 cm/s LVOT VTI:    0.146 m  AORTA Ao Root diam: 3.30 cm Ao Asc diam:  3.40 cm MITRAL VALVE               TRICUSPID VALVE MV Area (PHT): 3.97 cm    TR Peak grad:   19.4 mmHg MV Decel Time: 191 msec    TR Vmax:        220.00 cm/s MV E velocity: 69.80 cm/s MV A velocity: 91.70 cm/s  SHUNTS MV E/A ratio:  0.76        Systemic VTI:  0.15 m                            Systemic Diam: 2.00 cm Kristeen Miss MD Electronically signed by Kristeen Miss MD Signature Date/Time: 06/20/2020/12:09:11 PM    Final (Updated)    US Abdomen Limited RUQ (LIVER/GB)  Result Date: 06/20/2020 CLINICAL DATA:  Right upper quadrant abdominal pain EXAM: ULTRASOUND ABDOMEN LIMITED RIGHT UPPER QUADRANT COMPARISON:  None. FINDINGS: Gallbladder: Numerous layering shadowing calculi are noted within the gallbladder. The gallbladder, however, is not distended, there is no gallbladder wall thickening, and no pericholecystic fluid is identified. There are multiple echogenic foci identified within the gallbladder wall demonstrating ring down artifact compatible with changes of adenomyomatosis. More nodular echogenic focus within the gallbladder fundus demonstrates ring down artifact on image # 18 and also represents adenomyomatosis. Common bile duct: Diameter: 4 mm in mid diameter Liver: No focal lesion identified. Within normal limits in parenchymal echogenicity. Portal vein is patent on color Doppler imaging with normal direction of blood flow towards the liver. Other: None. IMPRESSION: Cholelithiasis without sonographic evidence of acute cholecystitis. Gallbladder adenomyomatosis. Electronically Signed   By: Helyn Numbers MD   On: 06/20/2020 03:05    Cardiac Studies     Patient Profile     55 y.o. male with a hx of hypertension, diabetes mellitus, cardiac mass, obesity, left groin abscess s/p I&D at [facility] 03/19/2020 who is being seen today for the evaluation of chest pain and  shortness of breath  Assessment & Plan    Exertional chest pain and shortness of breath: Symptoms concerning for stable angina.  Had scheduled outpatient coronary CT next month.  Troponin negative here.  Cardiac risk factor includes hypertension, uncontrolled diabetes and family history of CAD.  Echo shows EF 50-55% -Given renal function, would favor myoview over coronary CTA.  Plan [facility] tomorrow, cath if high risk findings  ?Cardiac mass: ?AV mass reported on OSH echo, cardiac MRI ordered as outpatient   Blood cultures negative.  Echo here shows AV calcification, no mass seen  Acute on chronic kidney disease stage III: Seems like baseline renal function around 1.5-1.7.  Improving, Cr 1.5 today -Avoid nephrotoxic agent  Abdominal pain: Elevated LFTs and lipase -Right upper quadrant ultrasound with cholelithiasis, no evidence of cholecystitis.  GI following  Diabetes mellitus with hyperglycemia: Blood sugar severely elevated.  Given AKI hold empagliflozin and ACE -Management per primary team  Hypertension: Hold home lisinopril given renal function -Continue amlodipine  Hyperkalemia: resolved   For questions or updates,  please contact [facility] Please consult [email or web address] for contact info under        Signed, Little Ishikawahristopher L Jaydeen Darley, MD  06/21/2020, 11:03 AM

## 2020-06-21 NOTE — Plan of Care (Signed)
  Problem: Safety: Goal: Ability to remain free from injury will improve Outcome: Progressing   

## 2020-06-21 NOTE — Progress Notes (Signed)
Lower extremity venous has been completed.   Preliminary results in CV Proc.   Blanch Media 06/21/2020 2:51 PM

## 2020-06-21 NOTE — Progress Notes (Signed)
Family Medicine Teaching Service Daily Progress Note Intern Pager: [phone]  Patient name: Joshua Nash Medical record number: [id number] Date of birth: 11-Aug-1965 Age: 55 y.o. Gender: male  Primary Care Provider: Charlott Rakes, MD Consultants: cardiology, GI   Code Status: Full Code  Pt Overview and Major Events to Date:  4/11 admitted  Assessment and Plan: Kolbie Lepkowski is a 55 y.o. male who presents with hyperkalemia, AKI, transaminitis, and anginal chest pain. PMHx significant for T2DM, HTN, transaminitis, CKD, post-Covid chronic fatigue, cardiac mass.  Angina pectoris Likely stable angina based on symptoms. TTE obtained revealed EF 50-55%, G1DD, no WMA. No chest pain this morning. Possible pharmacologic stress test this admission per cardiology. - cardiology consulted, appreciate recommendations  AKI superimposed on CKD stage IIIa Improved, serum creatinine (1.52) now at baseline 1.5-1.7.  Evaluating for nephrotic syndrome though no signs of volume overload clinically. - f/u urine protein/creatinine ratio - d/c mIVF NS - holding lisinopril - avoid nephrotoxic agents - monitor on BMP  T2DM A1c this admission 12.8%, poorly controlled. Adequately controlled, fasting glucose 136. - Lantus 20 units daily - moderate SSI - monitor CBG - hold empagliflozin  HTN Adequately controlled, SBP 120s-130s. - continue amlodipine 10 mg - holding lisinopril  HLD Significantly elevated with total cholesterol 512 and direct LDL 281. Holding off on statin at this time due to transaminitis.  Transaminitis Cholestatic transaminitis with significant elevated ALP and mild elevation in AST and ALT with significantly elevated GGT. Slight improvement from yesterday. Concern for PBC. MRCP overall unremarkable except for hepatomegaly and cholethiasis without ductal dilation or concern for cholecystitis. May need liver biopsy and/or surgery consultation for cholecystectomy. -  monitor on CMP - f/u autoimmune liver labs - GI consulted, appreciated recommendations  Cardiac mass Thickened calcification on the LCC noted on TTE. Considering TEE or cardiac MRI. - cardiology following, appreciate recommendations  Normocytic anemia Mild, stable.  Ferritin elevated which suggest anemia of chronic inflammation.  B12 and folate levels normal. - recommending screening colonoscopy on f/u  Pruritis Unclear etiology, no significant elevation in bilirubin to explain pruritus. Still having pruritis this AM. - Sarna cream - cholestyramine  FEN/GI: carb-modified diet PPx: LMWH  Disposition: med-tele  Subjective:  NAOE.  Patient feels good at this time, denies any chest pain.  Still having some itching, medications do not appear to be that effective.  Objective: Temp:  [98.2 F (36.8 C)-99.4 F (37.4 C)] 98.2 F (36.8 C) (04/13 0500) Pulse Rate:  [90-95] 90 (04/13 0500) Resp:  [16] 16 (04/12 2009) BP: (128-138)/(75-81) 128/75 (04/13 0500) SpO2:  [97 %-99 %] 99 % (04/13 0500) Weight:  [73.7 kg] 73.7 kg (04/13 0500) Physical Exam: General: Overweight male laying in bed comfortably, NAD Cardiovascular: RRR, no murmurs Respiratory: CTAB, no respiratory distress Abdomen: Soft, mild tenderness to epigastric, positive bowel sounds Extremities: WWP, no edema  Laboratory: Recent Labs  Lab 06/19/20 1001  WBC 8.5  HGB 12.5*  HCT 40.4  PLT 335   Recent Labs  Lab 06/19/20 1001 06/19/20 1150 06/19/20 1823 06/20/20 0145  NA 129*  --  136 134*  K 6.4*  --  4.8 5.0  CL 100  --  106 109  CO2 21*  --  23 19*  BUN 29*  --  25* 23*  CREATININE 1.90*  --  1.63* 1.82*  CALCIUM 9.5  --  10.1 9.2  PROT  --  7.3  --  6.2*  BILITOT  --  1.0  --  0.9  ALKPHOS  --  777*  --  649*  ALT  --  90*  --  78*  AST  --  57*  --  65*  GLUCOSE 516*  --  100* 155*     Imaging/Diagnostic Tests: MR ABDOMEN MRCP W WO CONTAST  Result Date: 06/20/2020 CLINICAL DATA:  Anorexia.  Right upper quadrant pain. Nausea. Malaise. Gallstones. 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 EXAM: MRI ABDOMEN WITHOUT AND WITH CONTRAST (INCLUDING MRCP) TECHNIQUE: Multiplanar multisequence MR imaging of the abdomen was performed both before and after the administration of intravenous contrast. Heavily T2-weighted images of the biliary and pancreatic ducts were obtained, and three-dimensional MRCP images were rendered by post processing. CONTRAST:  7.36mL GADAVIST GADOBUTROL 1 MMOL/ML IV SOLN COMPARISON:  06/20/2020 ultrasound and 06/19/2020 CT. FINDINGS: Portions of exam are mildly motion degraded. Lower chest: Normal heart size without pericardial or pleural effusion. Hepatobiliary: Hepatomegaly, 19.4 cm craniocaudal. No steatosis. No cirrhosis. No focal liver lesion. Multiple small gallstones. No acute cholecystitis or biliary duct dilatation. No choledocholithiasis. Pancreas:  Normal, without mass or ductal dilatation. Spleen:  Normal in size, without focal abnormality. Adrenals/Urinary Tract: Normal adrenal glands. Tiny bilateral renal cysts. No hydronephrosis. Stomach/Bowel: Proximal gastric underdistention. Normal abdominal small bowel loops. Colonic stool burden suggests constipation. Vascular/Lymphatic: Aortic atherosclerosis. No retroperitoneal or retrocrural adenopathy. Other:  No ascites. Musculoskeletal: No acute osseous abnormality. IMPRESSION: 1. Mildly motion degraded exam. 2. Cholelithiasis without acute cholecystitis or biliary duct dilatation. 3. Hepatomegaly. 4. Possible constipation. Electronically Signed   By: Jeronimo Greaves M.D.   On: 06/20/2020 20:16   ECHOCARDIOGRAM COMPLETE  Result Date: 06/20/2020    ECHOCARDIOGRAM REPORT   Patient Name:   Joshua Nash Date of Exam: 06/20/2020 Medical Rec #:  [id number]             Height:       65.0 in Accession #:    [id number]            Weight:       161.9 lb Date of Birth:  Feb 26, 1966             BSA:          1.808 m Patient Age:     54 years              BP:           143/88 mmHg Patient Gender: M                     HR:           91 bpm. Exam Location:  Inpatient Procedure: 2D Echo Indications:     Chest Pain R07.9  History:         Patient has no prior history of Echocardiogram examinations.                  Known Aortic Valve Cardiac mass; Risk Factors:Diabetes and                  Hypertension.  Sonographer:     Thurman Coyer RDCS (AE) Referring Phys:  [id number] Manson Passey Diagnosing Phys: Kristeen Miss MD IMPRESSIONS  1. Left ventricular ejection fraction, by estimation, is 50 to 55%. The left ventricle has low normal function. The left ventricle has no regional wall motion abnormalities. Left ventricular diastolic parameters are consistent with Grade I diastolic dysfunction (impaired relaxation).  2. Right ventricular systolic function is normal. The right ventricular size is normal. There is normal pulmonary artery  systolic pressure.  3. The mitral valve is grossly normal. Trivial mitral valve regurgitation.  4. There is a thickened calcification on the LCC. No previous echo is available for review. . The aortic valve is grossly normal. Aortic valve regurgitation is not visualized. FINDINGS  Left Ventricle: Left ventricular ejection fraction, by estimation, is 50 to 55%. The left ventricle has low normal function. The left ventricle has no regional wall motion abnormalities. The left ventricular internal cavity size was normal in size. There is no left ventricular hypertrophy. Left ventricular diastolic parameters are consistent with Grade I diastolic dysfunction (impaired relaxation). Right Ventricle: The right ventricular size is normal. No increase in right ventricular wall thickness. Right ventricular systolic function is normal. There is normal pulmonary artery systolic pressure. The tricuspid regurgitant velocity is 2.20 m/s, and  with an assumed right atrial pressure of 3 mmHg, the estimated right ventricular systolic  pressure is 22.4 mmHg. Left Atrium: Left atrial size was normal in size. Right Atrium: Right atrial size was normal in size. Pericardium: There is no evidence of pericardial effusion. Mitral Valve: The mitral valve is grossly normal. Trivial mitral valve regurgitation. Tricuspid Valve: The tricuspid valve is grossly normal. Tricuspid valve regurgitation is trivial. Aortic Valve: There is a thickened calcification on the LCC. No previous echo is available for review. The aortic valve is grossly normal. Aortic valve regurgitation is not visualized. Pulmonic Valve: The pulmonic valve was normal in structure. Pulmonic valve regurgitation is not visualized. Aorta: The aortic root and ascending aorta are structurally normal, with no evidence of dilitation. IAS/Shunts: The atrial septum is grossly normal.  LEFT VENTRICLE PLAX 2D LVIDd:         4.20 cm  Diastology LVIDs:         2.80 cm  LV e' medial:    4.22 cm/s LV PW:         1.10 cm  LV E/e' medial:  16.5 LV IVS:        1.00 cm  LV e' lateral:   7.70 cm/s LVOT diam:     2.00 cm  LV E/e' lateral: 9.1 LV SV:         46 LV SV Index:   25 LVOT Area:     3.14 cm  RIGHT VENTRICLE RV S prime:     12.90 cm/s TAPSE (M-mode): 2.0 cm LEFT ATRIUM           Index       RIGHT ATRIUM           Index LA diam:      3.50 cm 1.94 cm/m  RA Area:     13.10 cm LA Vol (A2C): 45.8 ml 25.33 ml/m RA Volume:   28.50 ml  15.76 ml/m LA Vol (A4C): 43.6 ml 24.11 ml/m  AORTIC VALVE LVOT Vmax:   99.00 cm/s LVOT Vmean:  54.900 cm/s LVOT VTI:    0.146 m  AORTA Ao Root diam: 3.30 cm Ao Asc diam:  3.40 cm MITRAL VALVE               TRICUSPID VALVE MV Area (PHT): 3.97 cm    TR Peak grad:   19.4 mmHg MV Decel Time: 191 msec    TR Vmax:        220.00 cm/s MV E velocity: 69.80 cm/s MV A velocity: 91.70 cm/s  SHUNTS MV E/A ratio:  0.76        Systemic VTI:  0.15 m  Systemic Diam: 2.00 cm Kristeen Miss MD Electronically signed by Kristeen Miss MD Signature Date/Time:  06/20/2020/12:09:11 PM    Final (Updated)      Littie Deeds, MD 06/21/2020, 7:31 AM PGY-1, [facility] Family Medicine FPTS Intern pager: [phone], text pages welcome

## 2020-06-22 ENCOUNTER — Inpatient Hospital Stay (HOSPITAL_COMMUNITY): Payer: Medicaid Other

## 2020-06-22 DIAGNOSIS — R079 Chest pain, unspecified: Secondary | ICD-10-CM

## 2020-06-22 LAB — IGG: IgG (Immunoglobin G), Serum: 950 mg/dL (ref 603–1613)

## 2020-06-22 LAB — NM MYOCAR MULTI W/SPECT W/WALL MOTION / EF
Estimated workload: 1 METS
Exercise duration (min): 0 min
Exercise duration (sec): 0 s
MPHR: 168 {beats}/min
Peak HR: 121 {beats}/min
Percent HR: 72 %
Rest HR: 93 {beats}/min
TID: 0.75

## 2020-06-22 LAB — CERULOPLASMIN: Ceruloplasmin: 31.3 mg/dL — ABNORMAL HIGH (ref 16.0–31.0)

## 2020-06-22 LAB — COMPREHENSIVE METABOLIC PANEL
ALT: 51 U/L — ABNORMAL HIGH (ref 0–44)
AST: 33 U/L (ref 15–41)
Albumin: 2.6 g/dL — ABNORMAL LOW (ref 3.5–5.0)
Alkaline Phosphatase: 539 U/L — ABNORMAL HIGH (ref 38–126)
Anion gap: 6 (ref 5–15)
BUN: 24 mg/dL — ABNORMAL HIGH (ref 6–20)
CO2: 21 mmol/L — ABNORMAL LOW (ref 22–32)
Calcium: 9.5 mg/dL (ref 8.9–10.3)
Chloride: 108 mmol/L (ref 98–111)
Creatinine, Ser: 1.47 mg/dL — ABNORMAL HIGH (ref 0.61–1.24)
GFR, Estimated: 57 mL/min — ABNORMAL LOW (ref >60)
Glucose, Bld: 167 mg/dL — ABNORMAL HIGH (ref 70–99)
Potassium: 5.4 mmol/L — ABNORMAL HIGH (ref 3.5–5.1)
Sodium: 135 mmol/L (ref 135–145)
Total Bilirubin: 0.8 mg/dL (ref 0.3–1.2)
Total Protein: 6.2 g/dL — ABNORMAL LOW (ref 6.5–8.1)

## 2020-06-22 LAB — GLUCOSE, CAPILLARY
Glucose-Capillary: 141 mg/dL — ABNORMAL HIGH (ref 70–99)
Glucose-Capillary: 174 mg/dL — ABNORMAL HIGH (ref 70–99)
Glucose-Capillary: 325 mg/dL — ABNORMAL HIGH (ref 70–99)
Glucose-Capillary: 203 mg/dL — ABNORMAL HIGH (ref 70–99)

## 2020-06-22 LAB — ANTI-SMOOTH MUSCLE ANTIBODY, IGG: F-Actin IgG: 4 Units (ref 0–19)

## 2020-06-22 LAB — ALPHA-1-ANTITRYPSIN: A-1 Antitrypsin, Ser: 121 mg/dL (ref 101–187)

## 2020-06-22 LAB — MITOCHONDRIAL ANTIBODIES: Mitochondrial M2 Ab, IgG: <20 Units (ref 0.0–20.0)

## 2020-06-22 LAB — POTASSIUM: Potassium: 4.7 mmol/L (ref 3.5–5.1)

## 2020-06-22 MED ORDER — TECHNETIUM TC 99M TETROFOSMIN IV KIT
31.2000 | PACK | Freq: Once | INTRAVENOUS | Status: AC | PRN
  Administered 2020-06-22: 30.1 via INTRAVENOUS

## 2020-06-22 MED ORDER — INSULIN GLARGINE 100 UNIT/ML ~~LOC~~ SOLN
21.0000 [IU] | Freq: Every day | SUBCUTANEOUS | Status: DC
  Administered 2020-06-22: 21 [IU] via SUBCUTANEOUS
  Filled 2020-06-22 (×2): qty 0.21

## 2020-06-22 MED ORDER — TECHNETIUM TC 99M TETROFOSMIN IV KIT
10.7000 | PACK | Freq: Once | INTRAVENOUS | Status: AC | PRN
  Administered 2020-06-22: 10.7 via INTRAVENOUS

## 2020-06-22 MED ORDER — REGADENOSON 0.4 MG/5ML IV SOLN
0.4000 mg | Freq: Once | INTRAVENOUS | Status: AC
  Administered 2020-06-22: 0.4 mg via INTRAVENOUS
  Filled 2020-06-22: qty 5

## 2020-06-22 MED ORDER — REGADENOSON 0.4 MG/5ML IV SOLN
INTRAVENOUS | Status: AC
  Filled 2020-06-22: qty 5

## 2020-06-22 NOTE — Progress Notes (Signed)
Daily Rounding Note  06/22/2020, 11:35 AM  LOS: 1 day   SUBJECTIVE:   Chief complaint: Cholestatic elevated LFTs.  Overall feeling better.  Some lingering mild upper abdominal pain.  No nausea or vomiting.  Tolerating solid food but n.p.o. as he underwent cardiac Myoview study this morning.  He is hungry.  BMs are brown, formed  OBJECTIVE:         Vital signs in last 24 hours:    Temp:  [98.1 F (36.7 C)-98.7 F (37.1 C)] 98.5 F (36.9 C) (04/14 0818) Pulse Rate:  [81-90] 84 (04/14 0818) Resp:  [11-19] 15 (04/14 1042) BP: (98-175)/(76-104) 136/83 (04/14 1042) SpO2:  [98 %-100 %] 98 % (04/14 0818) Weight:  [72.9 kg] 72.9 kg (04/14 0442) Last BM Date: 06/21/20 Filed Weights   06/20/20 0410 06/21/20 0500 06/22/20 0442  Weight: 73.4 kg 73.7 kg 72.9 kg   General: Looks better.  Comfortable, alert. Heart: RRR. Chest: Clear bilaterally.  No labored breathing or cough.  Good breath sounds. Abdomen: Soft.  Minimal tenderness across the upper abdomen bilaterally.  No guarding or rebound.  Active bowel sounds.  Nondistended.  No HSM, masses, bruits, hernias Extremities: No CCE. Neuro/Psych: Alert.  Appropriate.  Moves all 4 limbs with full strength.  No tremors.  No asterixis.  Intake/Output from previous day: 04/13 0701 - 04/14 0700 In: 1418.8 [P.O.:600; I.V.:818.8] Out: 1425 [Urine:1425]  Intake/Output this shift: No intake/output data recorded.  Lab Results: Recent Labs    06/21/20 0610  WBC 8.2  HGB 11.7*  HCT 37.4*  PLT 299   BMET Recent Labs    06/20/20 0145 06/21/20 0610 06/22/20 0403  NA 134* 136 135  K 5.0 4.7 5.4*  CL 109 112* 108  CO2 19* 20* 21*  GLUCOSE 155* 136* 167*  BUN 23* 21* 24*  CREATININE 1.82* 1.52* 1.47*  CALCIUM 9.2 9.2 9.5   LFT Recent Labs    06/19/20 1150 06/20/20 0145 06/21/20 0610 06/22/20 0403  PROT 7.3 6.2* 6.3* 6.2*  ALBUMIN 3.0* 2.5* 2.6* 2.6*  AST 57* 65* 34  33  ALT 90* 78* 58* 51*  ALKPHOS 777* 649* 601* 539*  BILITOT 1.0 0.9 0.9 0.8  BILIDIR 0.3*  --   --   --   IBILI 0.7  --   --   --    PT/INR Recent Labs    06/21/20 0610  LABPROT 11.8  INR 0.9   Hepatitis Panel Recent Labs    06/19/20 1823  HEPBSAG NON REACTIVE  HCVAB NON REACTIVE  HEPAIGM NON REACTIVE  HEPBIGM NON REACTIVE    Studies/Results: MR ABDOMEN MRCP W WO CONTAST  Result Date: 06/20/2020 CLINICAL DATA:  Anorexia. Right upper quadrant pain. Nausea. Malaise. Gallstones. 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 EXAM: MRI ABDOMEN WITHOUT AND WITH CONTRAST (INCLUDING MRCP) TECHNIQUE: Multiplanar multisequence MR imaging of the abdomen was performed both before and after the administration of intravenous contrast. Heavily T2-weighted images of the biliary and pancreatic ducts were obtained, and three-dimensional MRCP images were rendered by post processing. CONTRAST:  7.19mL GADAVIST GADOBUTROL 1 MMOL/ML IV SOLN COMPARISON:  06/20/2020 ultrasound and 06/19/2020 CT. FINDINGS: Portions of exam are mildly motion degraded. Lower chest: Normal heart size without pericardial or pleural effusion. Hepatobiliary: Hepatomegaly, 19.4 cm craniocaudal. No steatosis. No cirrhosis. No focal liver lesion. Multiple small gallstones. No acute cholecystitis or biliary duct dilatation. No choledocholithiasis. Pancreas:  Normal, without mass or ductal dilatation. Spleen:  Normal  in size, without focal abnormality. Adrenals/Urinary Tract: Normal adrenal glands. Tiny bilateral renal cysts. No hydronephrosis. Stomach/Bowel: Proximal gastric underdistention. Normal abdominal small bowel loops. Colonic stool burden suggests constipation. Vascular/Lymphatic: Aortic atherosclerosis. No retroperitoneal or retrocrural adenopathy. Other:  No ascites. Musculoskeletal: No acute osseous abnormality. IMPRESSION: 1. Mildly motion degraded exam. 2. Cholelithiasis without acute cholecystitis or biliary duct  dilatation. 3. Hepatomegaly. 4. Possible constipation. Electronically Signed   By: Abigail Miyamoto M.D.   On: 06/20/2020 20:16   VAS [place] LOWER EXTREMITY VENOUS (DVT)  Result Date: 06/21/2020  Lower Venous DVT Study Indications: Swelling.  Comparison Study: No prior studies. Performing Technologist: Abram Sander RVS  Examination Guidelines: A complete evaluation includes B-mode imaging, spectral Doppler, color Doppler, and power Doppler as needed of all accessible portions of each vessel. Bilateral testing is considered an integral part of a complete examination. Limited examinations for reoccurring indications may be performed as noted. The reflux portion of the exam is performed with the patient in reverse Trendelenburg.  +---------+---------------+---------+-----------+----------+--------------+ RIGHT    CompressibilityPhasicitySpontaneityPropertiesThrombus Aging +---------+---------------+---------+-----------+----------+--------------+ CFV      Full           Yes      Yes                                 +---------+---------------+---------+-----------+----------+--------------+ SFJ      Full                                                        +---------+---------------+---------+-----------+----------+--------------+ FV Prox  Full                                                        +---------+---------------+---------+-----------+----------+--------------+ FV Mid   Full                                                        +---------+---------------+---------+-----------+----------+--------------+ FV DistalFull                                                        +---------+---------------+---------+-----------+----------+--------------+ PFV      Full                                                        +---------+---------------+---------+-----------+----------+--------------+ POP      Full           Yes      Yes                                  +---------+---------------+---------+-----------+----------+--------------+  PTV      Full                                                        +---------+---------------+---------+-----------+----------+--------------+ PERO     Full                                                        +---------+---------------+---------+-----------+----------+--------------+   +---------+---------------+---------+-----------+----------+--------------+ LEFT     CompressibilityPhasicitySpontaneityPropertiesThrombus Aging +---------+---------------+---------+-----------+----------+--------------+ CFV      Full           Yes      Yes                                 +---------+---------------+---------+-----------+----------+--------------+ SFJ      Full                                                        +---------+---------------+---------+-----------+----------+--------------+ FV Prox  Full                                                        +---------+---------------+---------+-----------+----------+--------------+ FV Mid   Full                                                        +---------+---------------+---------+-----------+----------+--------------+ FV DistalFull                                                        +---------+---------------+---------+-----------+----------+--------------+ PFV      Full                                                        +---------+---------------+---------+-----------+----------+--------------+ POP      Full           Yes      Yes                                 +---------+---------------+---------+-----------+----------+--------------+ PTV      Full                                                        +---------+---------------+---------+-----------+----------+--------------+  PERO     Full                                                         +---------+---------------+---------+-----------+----------+--------------+     Summary: RIGHT: - There is no evidence of deep vein thrombosis in the lower extremity.  - No cystic structure found in the popliteal fossa.  LEFT: - There is no evidence of deep vein thrombosis in the lower extremity.  - No cystic structure found in the popliteal fossa.  *See table(s) above for measurements and observations. Electronically signed by Monica Martinez MD on 06/21/2020 at 5:45:15 PM.    Final     ASSESMENT:   *    Elevated LFTs, cholestatic pattern, abdominal pain. CTAP, ultrasound: GB w dense calcification, likely multiple small stones, GB adenomyomatosis, ducts wnl, liver wnl.   MRI/MRCP: Cholelithiasis, no cholecystitis, ducts within normal limits.  Hepatomegaly.  Mildly motion degraded exam. Transaminases, alk phos steadily improving.  T bili never elevated. Ceruloplasmin 31.3 (RR is 16-31).  Alpha 1 antitrypsin normal. IgG normal. Pending labs include smooth muscle Ab, mitochondrial Ab, ANA. ? AIH, ? DILI.    *   Exertional chest pain and dyspnea.  EKG and troponins nonischemic. Lexiscan Myoview a.m. 4/14 await interpretation. Possible aortic valve mass.  Cardiac MRI planned.   PLAN   *    Await pending blood work.  *   Ordered carb modified diet as he was n.p.o. for the Myoview he had this morning.  *    ?  Obtain inpatient surgical consult though probably not a surgical candidate until all his cardiac issues are sorted out.  However it may be hard for him to follow-up at surgical office as an outpatient since he lives in [place].    Joshua Nash  06/22/2020, 11:35 AM Phone [phone]

## 2020-06-22 NOTE — Progress Notes (Signed)
Family Medicine Teaching Service Daily Progress Note Intern Pager: [phone]  Patient name: Joshua Nash Medical record number: [id number] Date of birth: Oct 03, 1965 Age: 55 y.o. Gender: male  Primary Care Provider: Charlott Rakes, MD Consultants: cardiology, GI   Code Status: Full Code  Pt Overview and Major Events to Date:  4/11 admitted  Assessment and Plan: Joshua Nash is a 55 y.o. male who presents with hyperkalemia, AKI, transaminitis, and anginal chest pain. PMHx significant for T2DM, HTN, transaminitis, CKD, post-Covid chronic fatigue.  Angina pectoris Likely stable angina based on symptoms. TTE obtained revealed EF 50-55%, G1DD, no WMA. No chest pain this morning. Pharmacologic stress test today. Joshua Nash today - cardiology consulted, appreciate recommendations  CKD stage IIIa Creatinine back at baseline (1.5-1.7).  Evaluating for nephrotic syndrome though no signs of volume overload clinically. - f/u urine protein/creatinine ratio - holding lisinopril - avoid nephrotoxic agents - monitor on BMP  Hyperkalemia Mildly hyperkalemic this morning with K 5.4. Will repeat and treat if still hyperkalemic. - repeat K at 900  T2DM A1c this admission 12.8%, poorly controlled. Adequately controlled, fasting glucose 141. Received 13 units fast-acting insulin in the past 24 hours. - increase Lantus 21 units daily - moderate SSI - monitor CBG - hold empagliflozin  HTN Adequately controlled, SBP 130s-140s. - continue amlodipine 10 mg - holding lisinopril  HLD Significantly elevated with total cholesterol 512 and direct LDL 281. Holding off on statin at this time due to transaminitis.  Transaminitis Cholestatic transaminitis with significant elevated ALP and mild elevation in AST and ALT with significantly elevated GGT. Slight improvement from yesterday. Concern for PBC. MRCP overall unremarkable except for hepatomegaly and cholethiasis without ductal  dilation or concern for cholecystitis. May need liver biopsy and/or surgery consultation for cholecystectomy. - monitor on CMP - f/u autoimmune liver labs - GI consulted, appreciated recommendations  Cardiac mass Thickened calcification on the LCC noted on TTE, no mass seen. No further workup planned. - cardiology following, appreciate recommendations  Normocytic anemia Mild, stable.  Ferritin elevated which suggest anemia of chronic inflammation.  B12 and folate levels normal. - recommending screening colonoscopy on f/u  Pruritis Unclear etiology, no significant elevation in bilirubin to explain pruritus. Still having pruritis this AM. - hydroxyzine TID prn - Sarna cream BID - cholestyramine BID  FEN/GI: NPO for myoview PPx: LMWH  Disposition: med-tele  Subjective:  NAOE.  Feels good overall, no pain currently. Still itchy.  Objective: Temp:  [98.1 F (36.7 C)-98.7 F (37.1 C)] 98.4 F (36.9 C) (04/14 0442) Pulse Rate:  [81-93] 81 (04/14 0442) Resp:  [15-19] 18 (04/14 0442) BP: (132-148)/(71-82) 139/80 (04/14 0442) SpO2:  [99 %-100 %] 100 % (04/14 0442) Weight:  [72.9 kg] 72.9 kg (04/14 0442) Physical Exam: General: Overweight male laying in bed comfortably, NAD Cardiovascular: RRR, no murmurs Respiratory: CTAB, no respiratory distress Abdomen: Soft, mild tenderness to epigastric and RUQ, positive bowel sounds Extremities: WWP, no edema  Laboratory: Recent Labs  Lab 06/19/20 1001 06/21/20 0610  WBC 8.5 8.2  HGB 12.5* 11.7*  HCT 40.4 37.4*  PLT 335 299   Recent Labs  Lab 06/20/20 0145 06/21/20 0610 06/22/20 0403  NA 134* 136 135  K 5.0 4.7 5.4*  CL 109 112* 108  CO2 19* 20* 21*  BUN 23* 21* 24*  CREATININE 1.82* 1.52* 1.47*  CALCIUM 9.2 9.2 9.5  PROT 6.2* 6.3* 6.2*  BILITOT 0.9 0.9 0.8  ALKPHOS 649* 601* 539*  ALT 78* 58* 51*  AST 65* 34  33  GLUCOSE 155* 136* 167*     Imaging/Diagnostic Tests: VAS [place] LOWER EXTREMITY VENOUS (DVT)  Result  Date: 06/21/2020  Lower Venous DVT Study Indications: Swelling.  Comparison Study: No prior studies. Performing Technologist: Blanch Media RVS  Examination Guidelines: A complete evaluation includes B-mode imaging, spectral Doppler, color Doppler, and power Doppler as needed of all accessible portions of each vessel. Bilateral testing is considered an integral part of a complete examination. Limited examinations for reoccurring indications may be performed as noted. The reflux portion of the exam is performed with the patient in reverse Trendelenburg.  +---------+---------------+---------+-----------+----------+--------------+ RIGHT    CompressibilityPhasicitySpontaneityPropertiesThrombus Aging +---------+---------------+---------+-----------+----------+--------------+ CFV      Full           Yes      Yes                                 +---------+---------------+---------+-----------+----------+--------------+ SFJ      Full                                                        +---------+---------------+---------+-----------+----------+--------------+ FV Prox  Full                                                        +---------+---------------+---------+-----------+----------+--------------+ FV Mid   Full                                                        +---------+---------------+---------+-----------+----------+--------------+ FV DistalFull                                                        +---------+---------------+---------+-----------+----------+--------------+ PFV      Full                                                        +---------+---------------+---------+-----------+----------+--------------+ POP      Full           Yes      Yes                                 +---------+---------------+---------+-----------+----------+--------------+ PTV      Full                                                         +---------+---------------+---------+-----------+----------+--------------+ PERO     Full                                                        +---------+---------------+---------+-----------+----------+--------------+   +---------+---------------+---------+-----------+----------+--------------+  LEFT     CompressibilityPhasicitySpontaneityPropertiesThrombus Aging +---------+---------------+---------+-----------+----------+--------------+ CFV      Full           Yes      Yes                                 +---------+---------------+---------+-----------+----------+--------------+ SFJ      Full                                                        +---------+---------------+---------+-----------+----------+--------------+ FV Prox  Full                                                        +---------+---------------+---------+-----------+----------+--------------+ FV Mid   Full                                                        +---------+---------------+---------+-----------+----------+--------------+ FV DistalFull                                                        +---------+---------------+---------+-----------+----------+--------------+ PFV      Full                                                        +---------+---------------+---------+-----------+----------+--------------+ POP      Full           Yes      Yes                                 +---------+---------------+---------+-----------+----------+--------------+ PTV      Full                                                        +---------+---------------+---------+-----------+----------+--------------+ PERO     Full                                                        +---------+---------------+---------+-----------+----------+--------------+     Summary: RIGHT: - There is no evidence of deep vein thrombosis in the lower extremity.  - No cystic structure found in  the popliteal fossa.  LEFT: - There is no evidence of deep vein thrombosis in the lower extremity.  - No  cystic structure found in the popliteal fossa.  *See table(s) above for measurements and observations. Electronically signed by Sherald Hess MD on 06/21/2020 at 5:45:15 PM.    Final      Littie Deeds, MD 06/22/2020, 7:34 AM PGY-1, [facility] Family Medicine FPTS Intern pager: [phone], text pages welcome

## 2020-06-22 NOTE — Progress Notes (Signed)
   Patient presented for a nuclear stress test today.Stress imaging is pending at this time.   Preliminary EKG findings may be listed in the chart, but the stress test result will not be finalized until perfusion imaging is complete.  Corrin Parker, PA-C 06/22/2020 11:10 AM

## 2020-06-22 NOTE — Progress Notes (Addendum)
Progress Note  Patient Name: Joshua Nash Date of Encounter: 06/22/2020  [facility] Cardiologist: Jenean Lindau, MD   Subjective   No acute overnight events. Saw patient down in nuclear medicine before [employer]. He continues to report some chest tightness with activity as well as some shortness of breath even at rest. Shortness of breath is about the same from when he was admitted - no better or no worse. He continues to have abdominal pain but no nausea or vomiting today.  Inpatient Medications    Scheduled Meds: . amLODipine  10 mg Oral Daily  . camphor-menthol   Topical BID  . cholestyramine  4 g Oral BID  . enoxaparin (LOVENOX) injection  40 mg Subcutaneous Q24H  . insulin aspart  0-15 Units Subcutaneous TID WC  . insulin glargine  21 Units Subcutaneous QHS  . pantoprazole  20 mg Oral Daily   Continuous Infusions:  PRN Meds: acetaminophen **OR** acetaminophen, hydrOXYzine, nitroGLYCERIN   Vital Signs    Vitals:   06/21/20 1707 06/21/20 2031 06/22/20 0442 06/22/20 0818  BP: (!) 142/82 (!) 148/81 139/80 135/78  Pulse:  90 81 84  Resp: _0 Temp: 98.1 F (36.7 C) 98.7 F (37.1 C) 98.4 F (36.9 C) 98.5 F (36.9 C)  TempSrc: Oral Oral Oral Oral  SpO2: 100% 100% 100% 98%  Weight:   72.9 kg     Intake/Output Summary (Last 24 hours) at 06/22/2020 0933 Last data filed at 06/22/2020 0442 Gross per 24 hour  Intake 1298.76 ml  Output 1425 ml  Net -126.24 ml   Last 3 Weights 06/22/2020 06/21/2020 06/20/2020  Weight (lbs) 160 lb 12.8 oz 162 lb 7.7 oz 161 lb 14.4 oz  Weight (kg) 72.938 kg 73.7 kg 73.437 kg      Telemetry    Unable to review telemetry down in nuclear medicine but remained in sinus rhythm throughout [employer] study. - Personally Reviewed  ECG    No new ECG tracing today. - Personally Reviewed  Physical Exam   GEN: No acute distress.   Neck: No JVD. Cardiac: Borderline tachycardic with normal rhythm. No  murmurs, rubs, or gallops. Radial pulses 2+ and equal bilaterally. Respiratory: Clear to auscultation bilaterally. No wheezes, rhonchi, or rales. GI: Soft and non-distended but diffusely tender to palpation. MS: No lower extremity edema. No deformity. Skin: Warm and dry. Neuro:  No focal deficits. Psych: Normal affect. Responds appropriately.  Labs    High Sensitivity Troponin:   Recent Labs  Lab 06/19/20 1001 06/19/20 1150  TROPONINIHS 7 6      Chemistry Recent Labs  Lab 06/20/20 0145 06/21/20 0610 06/22/20 0403  NA 134* 136 135  K 5.0 4.7 5.4*  CL 109 112* 108  CO2 19* 20* 21*  GLUCOSE 155* 136* 167*  BUN 23* 21* 24*  CREATININE 1.82* 1.52* 1.47*  CALCIUM 9.2 9.2 9.5  PROT 6.2* 6.3* 6.2*  ALBUMIN 2.5* 2.6* 2.6*  AST 65* 34 33  ALT 78* 58* 51*  ALKPHOS 649* 601* 539*  BILITOT 0.9 0.9 0.8  GFRNONAA 44* 55* 57*  ANIONGAP 6 4* 6     Hematology Recent Labs  Lab 06/19/20 1001 06/21/20 0610  WBC 8.5 8.2  RBC 4.98 4.70  HGB 12.5* 11.7*  HCT 40.4 37.4*  MCV 81.1 79.6*  MCH 25.1* 24.9*  MCHC 30.9 31.3  RDW 17.4* 17.2*  PLT 335 299    BNP Recent Labs  Lab 06/19/20 1823  BNP 13.6  DDimer No results for input(s): DDIMER in the last 168 hours.   Radiology    MR ABDOMEN MRCP W WO CONTAST  Result Date: 06/20/2020 CLINICAL DATA:  Anorexia. Right upper quadrant pain. Nausea. Malaise. Gallstones. 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 EXAM: MRI ABDOMEN WITHOUT AND WITH CONTRAST (INCLUDING MRCP) TECHNIQUE: Multiplanar multisequence MR imaging of the abdomen was performed both before and after the administration of intravenous contrast. Heavily T2-weighted images of the biliary and pancreatic ducts were obtained, and three-dimensional MRCP images were rendered by post processing. CONTRAST:  7.70m GADAVIST GADOBUTROL 1 MMOL/ML IV SOLN COMPARISON:  06/20/2020 ultrasound and 06/19/2020 CT. FINDINGS: Portions of exam are mildly motion degraded. Lower chest:  Normal heart size without pericardial or pleural effusion. Hepatobiliary: Hepatomegaly, 19.4 cm craniocaudal. No steatosis. No cirrhosis. No focal liver lesion. Multiple small gallstones. No acute cholecystitis or biliary duct dilatation. No choledocholithiasis. Pancreas:  Normal, without mass or ductal dilatation. Spleen:  Normal in size, without focal abnormality. Adrenals/Urinary Tract: Normal adrenal glands. Tiny bilateral renal cysts. No hydronephrosis. Stomach/Bowel: Proximal gastric underdistention. Normal abdominal small bowel loops. Colonic stool burden suggests constipation. Vascular/Lymphatic: Aortic atherosclerosis. No retroperitoneal or retrocrural adenopathy. Other:  No ascites. Musculoskeletal: No acute osseous abnormality. IMPRESSION: 1. Mildly motion degraded exam. 2. Cholelithiasis without acute cholecystitis or biliary duct dilatation. 3. Hepatomegaly. 4. Possible constipation. Electronically Signed   By: KAbigail MiyamotoM.D.   On: 06/20/2020 20:16   ECHOCARDIOGRAM COMPLETE  Result Date: 06/20/2020    ECHOCARDIOGRAM REPORT   Patient Name:   Joshua MEACHAMDate of Exam: 06/20/2020 Medical Rec #:  [id number]            Height:       65.0 in Accession #:    [id number]           Weight:       161.9 lb Date of Birth:  616-Nov-1967            BSA:          1.808 m Patient Age:    55years              BP:           143/88 mmHg Patient Gender: M                     HR:           91 bpm. Exam Location:  Inpatient Procedure: 2D Echo Indications:     Chest Pain R07.9  History:         Patient has no prior history of Echocardiogram examinations.                  Known Aortic Valve Cardiac mass; Risk Factors:Diabetes and                  Hypertension.  Sonographer:     CMikki SanteeRDCS (AE) Referring Phys:  [id number] KailDiagnosing Phys: PMertie MooresMD IMPRESSIONS  1. Left ventricular ejection fraction, by estimation, is 50 to 55%. The left ventricle has low normal function. The  left ventricle has no regional wall motion abnormalities. Left ventricular diastolic parameters are consistent with Grade I diastolic dysfunction (impaired relaxation).  2. Right ventricular systolic function is normal. The right ventricular size is normal. There is normal pulmonary artery systolic pressure.  3. The mitral valve is grossly normal. Trivial mitral valve regurgitation.  4. There is a thickened calcification on  the [place]. No previous echo is available for review. . The aortic valve is grossly normal. Aortic valve regurgitation is not visualized. FINDINGS  Left Ventricle: Left ventricular ejection fraction, by estimation, is 50 to 55%. The left ventricle has low normal function. The left ventricle has no regional wall motion abnormalities. The left ventricular internal cavity size was normal in size. There is no left ventricular hypertrophy. Left ventricular diastolic parameters are consistent with Grade I diastolic dysfunction (impaired relaxation). Right Ventricle: The right ventricular size is normal. No increase in right ventricular wall thickness. Right ventricular systolic function is normal. There is normal pulmonary artery systolic pressure. The tricuspid regurgitant velocity is 2.20 m/s, and  with an assumed right atrial pressure of 3 mmHg, the estimated right ventricular systolic pressure is 64.6 mmHg. Left Atrium: Left atrial size was normal in size. Right Atrium: Right atrial size was normal in size. Pericardium: There is no evidence of pericardial effusion. Mitral Valve: The mitral valve is grossly normal. Trivial mitral valve regurgitation. Tricuspid Valve: The tricuspid valve is grossly normal. Tricuspid valve regurgitation is trivial. Aortic Valve: There is a thickened calcification on the LCC. No previous echo is available for review. The aortic valve is grossly normal. Aortic valve regurgitation is not visualized. Pulmonic Valve: The pulmonic valve was normal in structure. Pulmonic valve  regurgitation is not visualized. Aorta: The aortic root and ascending aorta are structurally normal, with no evidence of dilitation. IAS/Shunts: The atrial septum is grossly normal.  LEFT VENTRICLE PLAX 2D LVIDd:         4.20 cm  Diastology LVIDs:         2.80 cm  LV e' medial:    4.22 cm/s LV PW:         1.10 cm  LV E/e' medial:  16.5 LV IVS:        1.00 cm  LV e' lateral:   7.70 cm/s LVOT diam:     2.00 cm  LV E/e' lateral: 9.1 LV SV:         46 LV SV Index:   25 LVOT Area:     3.14 cm  RIGHT VENTRICLE RV S prime:     12.90 cm/s TAPSE (M-mode): 2.0 cm LEFT ATRIUM           Index       RIGHT ATRIUM           Index LA diam:      3.50 cm 1.94 cm/m  RA Area:     13.10 cm LA Vol (A2C): 45.8 ml 25.33 ml/m RA Volume:   28.50 ml  15.76 ml/m LA Vol (A4C): 43.6 ml 24.11 ml/m  AORTIC VALVE LVOT Vmax:   99.00 cm/s LVOT Vmean:  54.900 cm/s LVOT VTI:    0.146 m  AORTA Ao Root diam: 3.30 cm Ao Asc diam:  3.40 cm MITRAL VALVE               TRICUSPID VALVE MV Area (PHT): 3.97 cm    TR Peak grad:   19.4 mmHg MV Decel Time: 191 msec    TR Vmax:        220.00 cm/s MV E velocity: 69.80 cm/s MV A velocity: 91.70 cm/s  SHUNTS MV E/A ratio:  0.76        Systemic VTI:  0.15 m  Systemic Diam: 2.00 cm Mertie Moores MD Electronically signed by Mertie Moores MD Signature Date/Time: 06/20/2020/12:09:11 PM    Final (Updated)    VAS [place] LOWER EXTREMITY VENOUS (DVT)  Result Date: 06/21/2020  Lower Venous DVT Study Indications: Swelling.  Comparison Study: No prior studies. Performing Technologist: Abram Sander RVS  Examination Guidelines: A complete evaluation includes B-mode imaging, spectral Doppler, color Doppler, and power Doppler as needed of all accessible portions of each vessel. Bilateral testing is considered an integral part of a complete examination. Limited examinations for reoccurring indications may be performed as noted. The reflux portion of the exam is performed with the patient in reverse  Trendelenburg.  +---------+---------------+---------+-----------+----------+--------------+ RIGHT    CompressibilityPhasicitySpontaneityPropertiesThrombus Aging +---------+---------------+---------+-----------+----------+--------------+ CFV      Full           Yes      Yes                                 +---------+---------------+---------+-----------+----------+--------------+ SFJ      Full                                                        +---------+---------------+---------+-----------+----------+--------------+ FV Prox  Full                                                        +---------+---------------+---------+-----------+----------+--------------+ FV Mid   Full                                                        +---------+---------------+---------+-----------+----------+--------------+ FV DistalFull                                                        +---------+---------------+---------+-----------+----------+--------------+ PFV      Full                                                        +---------+---------------+---------+-----------+----------+--------------+ POP      Full           Yes      Yes                                 +---------+---------------+---------+-----------+----------+--------------+ PTV      Full                                                        +---------+---------------+---------+-----------+----------+--------------+  PERO     Full                                                        +---------+---------------+---------+-----------+----------+--------------+   +---------+---------------+---------+-----------+----------+--------------+ LEFT     CompressibilityPhasicitySpontaneityPropertiesThrombus Aging +---------+---------------+---------+-----------+----------+--------------+ CFV      Full           Yes      Yes                                  +---------+---------------+---------+-----------+----------+--------------+ SFJ      Full                                                        +---------+---------------+---------+-----------+----------+--------------+ FV Prox  Full                                                        +---------+---------------+---------+-----------+----------+--------------+ FV Mid   Full                                                        +---------+---------------+---------+-----------+----------+--------------+ FV DistalFull                                                        +---------+---------------+---------+-----------+----------+--------------+ PFV      Full                                                        +---------+---------------+---------+-----------+----------+--------------+ POP      Full           Yes      Yes                                 +---------+---------------+---------+-----------+----------+--------------+ PTV      Full                                                        +---------+---------------+---------+-----------+----------+--------------+ PERO     Full                                                        +---------+---------------+---------+-----------+----------+--------------+  Summary: RIGHT: - There is no evidence of deep vein thrombosis in the lower extremity.  - No cystic structure found in the popliteal fossa.  LEFT: - There is no evidence of deep vein thrombosis in the lower extremity.  - No cystic structure found in the popliteal fossa.  *See table(s) above for measurements and observations. Electronically signed by Monica Martinez MD on 06/21/2020 at 5:45:15 PM.    Final     Cardiac Studies   Echocardiogram 06/20/2020: Impressions: 1. Left ventricular ejection fraction, by estimation, is 50 to 55%. The  left ventricle has low normal function. The left ventricle has no regional  wall motion  abnormalities. Left ventricular diastolic parameters are  consistent with Grade I diastolic  dysfunction (impaired relaxation).  2. Right ventricular systolic function is normal. The right ventricular  size is normal. There is normal pulmonary artery systolic pressure.  3. The mitral valve is grossly normal. Trivial mitral valve  regurgitation.  4. There is a thickened calcification on the LCC. No previous echo is  available for review. The aortic valve is grossly normal. Aortic valve  regurgitation is not visualized.   Patient Profile     55 y.o. 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   Assessment & Plan    Exertional Chest Pain and Dyspnea - Symptoms concerning for unstable angina. Outpatient coronary CTA had been ordered and was scheduled for next month.  - EKG shows no acute ischemic changes. - High-sensitivity troponin negative x2. - Echo showed LVEF of 50-55% with normal wall motion and grade 1 diastolic dysfunction. - Continue to report some chest tightness with activity. - Patient underwent Lexiscan Myoview this morning. Results pending.   Of note, systolic BP dropped from the 170's to 98 after Lexiscan infusion and patient became slightly pale and had significant chest pain with this. BP quickly improved back to baseline with systolic BP in the 621'H to 140's and symptoms improved with this as well.   Possible Aortic Valve Mass - Questionable aortic valve mass noted on recent Echo at outpatient hospital. Cardiac MRI pending.  - Echo this admission shows thickened calcification on the left coronary cusp of the aortic valve but no mass seen. - Blood cultures here negative. - No further work-up planned.  Abdominal Pain - Patient continues to complain of abdominal pain.   - LFT's elevated but slowly improving. Today: AST 33, ALT 51, Alk Phos 539, Total Bili 0.8. - Lipase elevated at 79. - Right upper quadrant ultrasound showed cholelithiasis but no evidence of cholecystitis.  - GI consulted. MRCP overall unremarkable except for hepatomegaly and cholelithiasis without ductal dilation or concern for cholecystitis.  - Management per primary team and GI.  Hypertension - Systolic BP dropped from 086 to 98 after administration of Lexiscan. BP has now returned to baseline in the 130's-140's/70's-80's. - Continue Amlodipine 55m daily for now.  Hyperlipidemia - Lipid panel this admission: Total Cholesterol 512, Triglycerides 474, HDL 51. Direct LDL 281.  - Statin has not been started yet due to transaminitis. Recommend starting high-intensity statin if LFTs improve. - May need referral to Dr. [facility]lipid clinic given significantly elevated total cholesterol and LDL.  Acute on CKD stage III - Creatinine 1.9 on admission. Improved to 1.47 today. Baseline around 1.5 to 1.7.  -  Home Lisinopril on hold. - Continue to monitor daily BMETs.  Hyperkalemia - Potassium 5.4 this morning.  - Primary team as already ordered repeat check later today.  - Management per primary team.  Otherwise, per primary team. - Anemia - Pruritis - Type 2 diabetes mellitus   Of note, [employer] Interpreter Kennyth Lose [id number]) used to help consent patient and answers any questions.  For questions or updates, please contact [place] Please consult [email or web address] for contact info under        Signed, Darreld Mclean, PA-C  06/22/2020, 9:33 AM     Patient seen and examined.  Agree with above documentation.  On exam, patient is alert and oriented, regular rate and rhythm, no murmurs, lungs CTAB, no LE edema or JVD.  Telemetry shows sinus rhythm with rate 80s to 110s and one 5 beat run of NSVT.  Stress test today shows normal perfusion.  No further cardiac work-up  recommended.  Donato Heinz, MD

## 2020-06-23 ENCOUNTER — Inpatient Hospital Stay (HOSPITAL_COMMUNITY): Payer: Medicaid Other

## 2020-06-23 DIAGNOSIS — E875 Hyperkalemia: Secondary | ICD-10-CM

## 2020-06-23 DIAGNOSIS — I152 Hypertension secondary to endocrine disorders: Secondary | ICD-10-CM

## 2020-06-23 DIAGNOSIS — E1159 Type 2 diabetes mellitus with other circulatory complications: Secondary | ICD-10-CM

## 2020-06-23 DIAGNOSIS — R748 Abnormal levels of other serum enzymes: Secondary | ICD-10-CM

## 2020-06-23 LAB — CBC
HCT: 36.7 % — ABNORMAL LOW (ref 39.0–52.0)
Hemoglobin: 11.6 g/dL — ABNORMAL LOW (ref 13.0–17.0)
MCH: 25.2 pg — ABNORMAL LOW (ref 26.0–34.0)
MCHC: 31.6 g/dL (ref 30.0–36.0)
MCV: 79.8 fL — ABNORMAL LOW (ref 80.0–100.0)
Platelets: 270 10*3/uL (ref 150–400)
RBC: 4.6 MIL/uL (ref 4.22–5.81)
RDW: 17.1 % — ABNORMAL HIGH (ref 11.5–15.5)
WBC: 8.8 10*3/uL (ref 4.0–10.5)
nRBC: 0 % (ref 0.0–0.2)

## 2020-06-23 LAB — COMPREHENSIVE METABOLIC PANEL
ALT: 39 U/L (ref 0–44)
AST: 23 U/L (ref 15–41)
Albumin: 2.6 g/dL — ABNORMAL LOW (ref 3.5–5.0)
Alkaline Phosphatase: 499 U/L — ABNORMAL HIGH (ref 38–126)
Anion gap: 4 — ABNORMAL LOW (ref 5–15)
BUN: 32 mg/dL — ABNORMAL HIGH (ref 6–20)
CO2: 20 mmol/L — ABNORMAL LOW (ref 22–32)
Calcium: 8.7 mg/dL — ABNORMAL LOW (ref 8.9–10.3)
Chloride: 107 mmol/L (ref 98–111)
Creatinine, Ser: 1.81 mg/dL — ABNORMAL HIGH (ref 0.61–1.24)
GFR, Estimated: 44 mL/min — ABNORMAL LOW (ref >60)
Glucose, Bld: 274 mg/dL — ABNORMAL HIGH (ref 70–99)
Potassium: 5.7 mmol/L — ABNORMAL HIGH (ref 3.5–5.1)
Sodium: 131 mmol/L — ABNORMAL LOW (ref 135–145)
Total Bilirubin: 0.7 mg/dL (ref 0.3–1.2)
Total Protein: 6.1 g/dL — ABNORMAL LOW (ref 6.5–8.1)

## 2020-06-23 LAB — BASIC METABOLIC PANEL
Anion gap: 4 — ABNORMAL LOW (ref 5–15)
BUN: 32 mg/dL — ABNORMAL HIGH (ref 6–20)
CO2: 21 mmol/L — ABNORMAL LOW (ref 22–32)
Calcium: 9 mg/dL (ref 8.9–10.3)
Chloride: 109 mmol/L (ref 98–111)
Creatinine, Ser: 1.76 mg/dL — ABNORMAL HIGH (ref 0.61–1.24)
GFR, Estimated: 46 mL/min — ABNORMAL LOW (ref >60)
Glucose, Bld: 299 mg/dL — ABNORMAL HIGH (ref 70–99)
Potassium: 5.1 mmol/L (ref 3.5–5.1)
Sodium: 134 mmol/L — ABNORMAL LOW (ref 135–145)

## 2020-06-23 LAB — GLUCOSE, CAPILLARY
Glucose-Capillary: 293 mg/dL — ABNORMAL HIGH (ref 70–99)
Glucose-Capillary: 289 mg/dL — ABNORMAL HIGH (ref 70–99)
Glucose-Capillary: 324 mg/dL — ABNORMAL HIGH (ref 70–99)

## 2020-06-23 LAB — ANTINUCLEAR ANTIBODIES, IFA: ANA Ab, IFA: NEGATIVE

## 2020-06-23 IMAGING — US US BIOPSY CORE LIVER
1 series · 12 of 12 positions shown · non-contrast
Comparison: none

INDICATION: 54-year-old male with elevated LFTs. He presents for random liver
biopsy.

[Series 1: us biopsy (liver) · 12 of 12 slices shown]
[im 1/12]
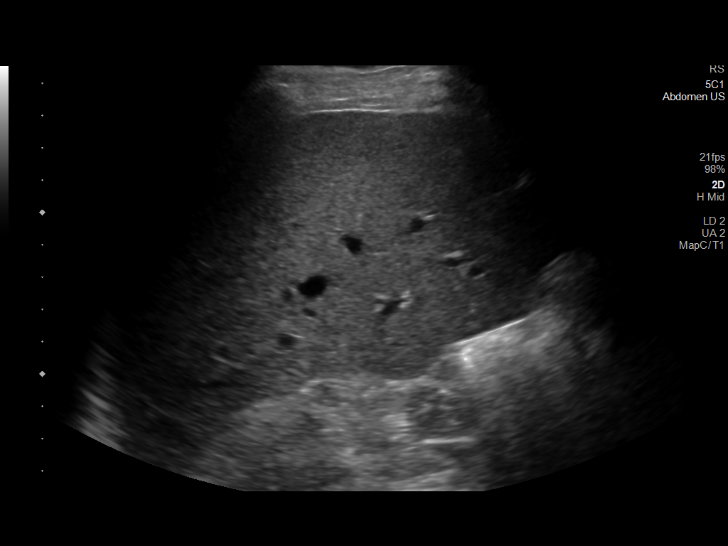
[im 2/12]
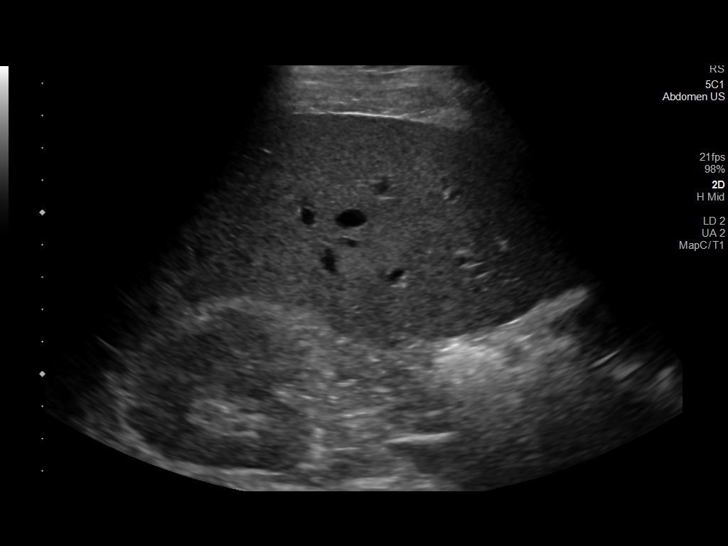
[im 3/12]
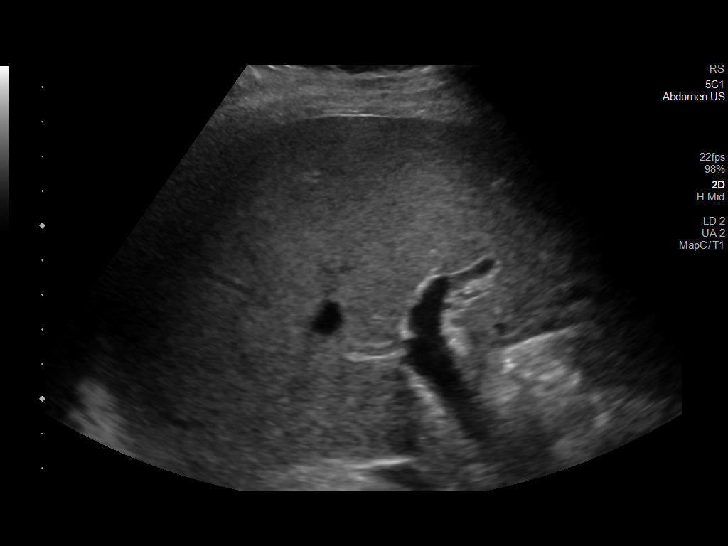
[im 4/12]
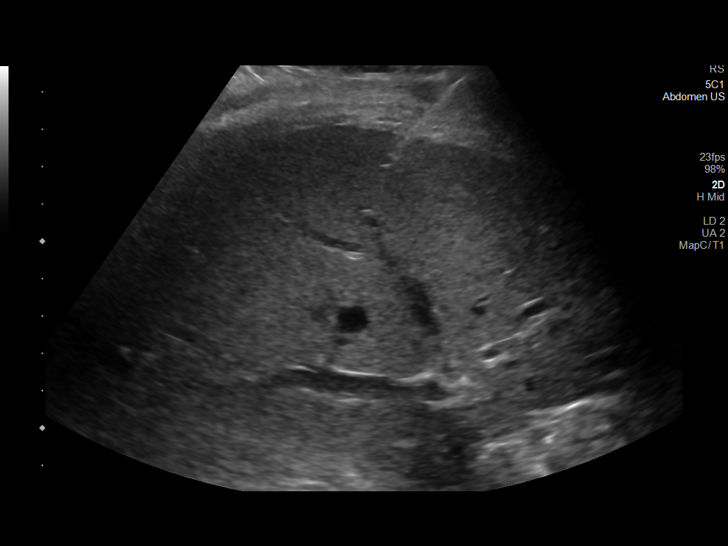
[im 5/12]
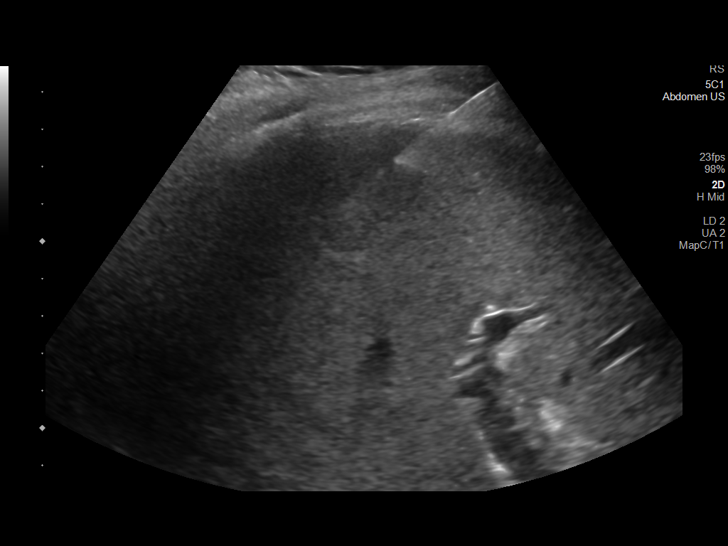
[im 6/12]
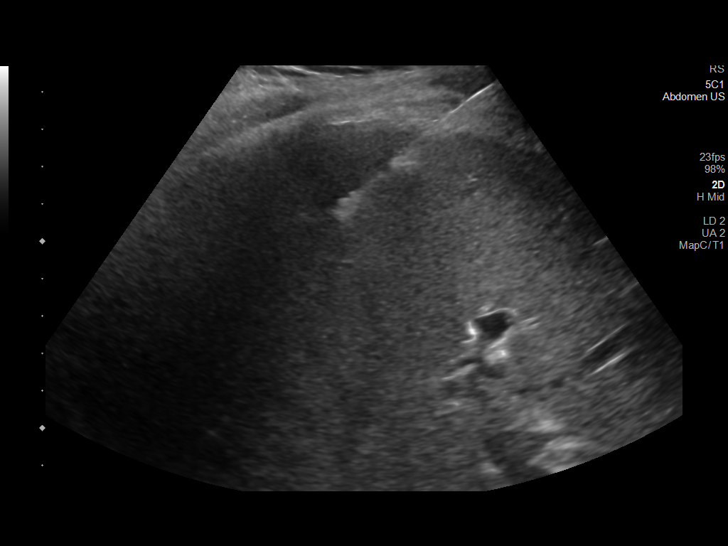
[im 7/12]
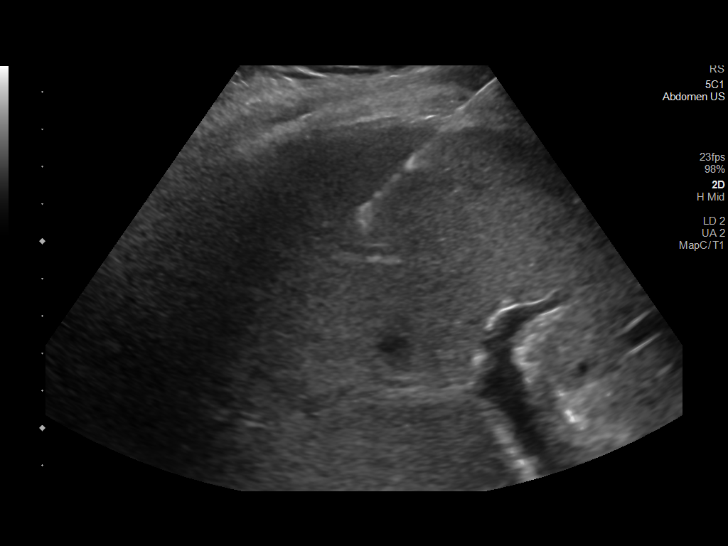
[im 8/12]
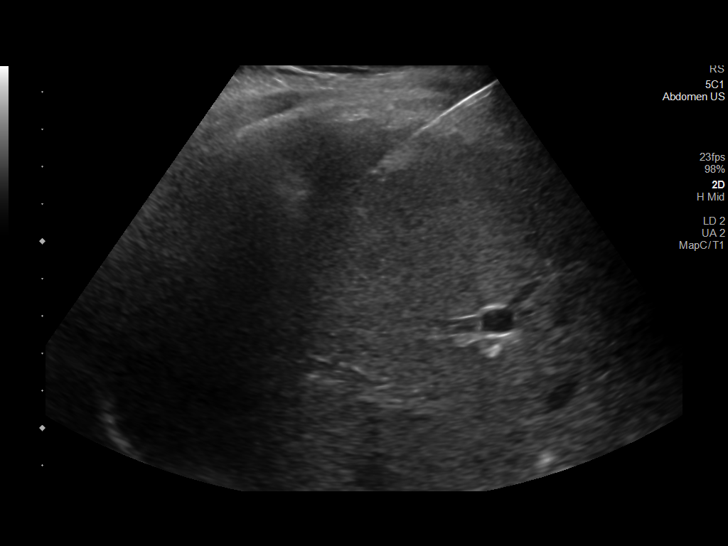
[im 9/12]
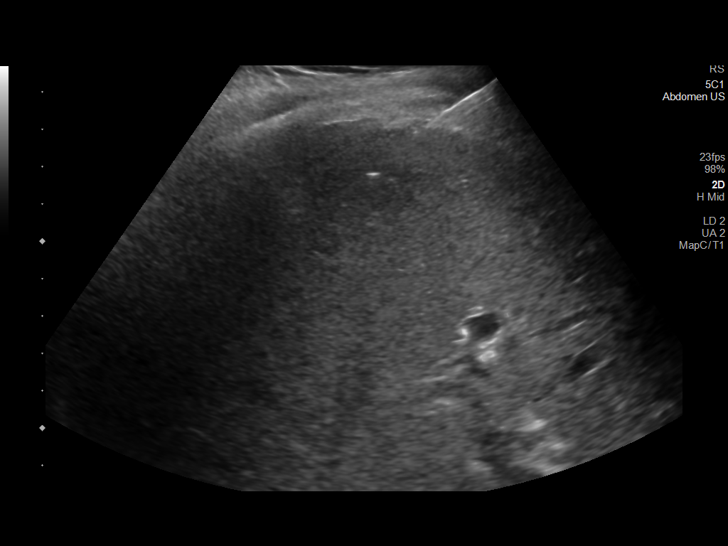
[im 10/12]
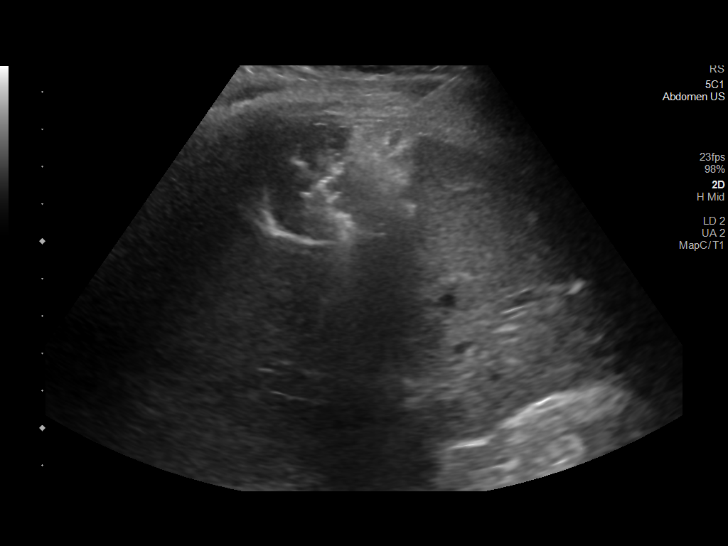
[im 11/12]
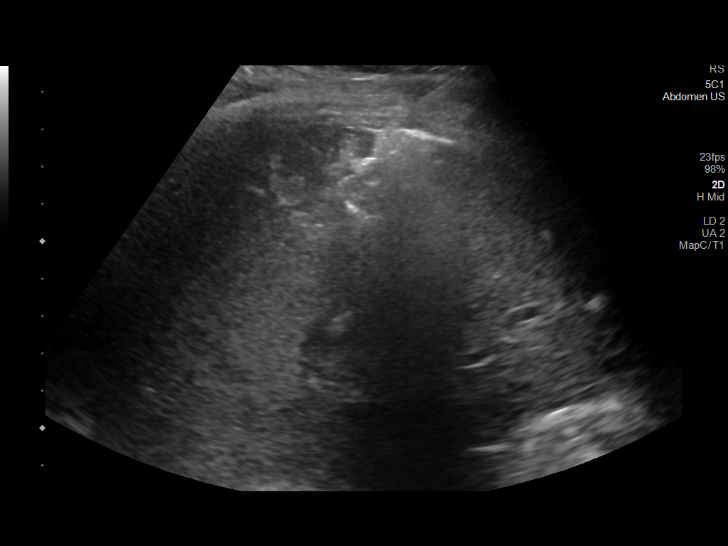
[im 12/12]
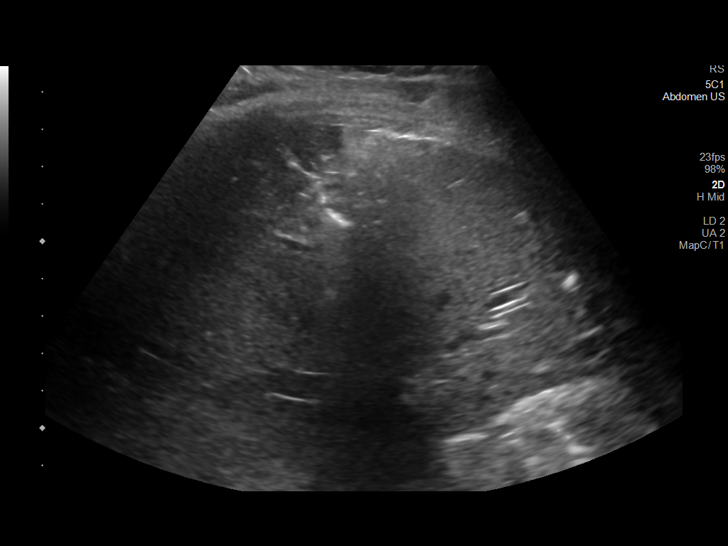

[12 of 12 positions shown; findings below may reference images not displayed]

EXAM:
Ultrasound-guided core biopsy of the liver

MEDICATIONS:
None.

ANESTHESIA/SEDATION:
Fentanyl 50 mcg IV; Versed 1 mg IV

Moderate Sedation Time:  10 minutes

The patient was continuously monitored during the procedure by the
interventional radiology nurse under my direct supervision.

FLUOROSCOPY TIME:  None.

COMPLICATIONS:
None immediate.

PROCEDURE:
Informed consent was obtained from the patient following explanation
of the procedure, risks, benefits and alternatives. The patient
understands, agrees and consents for the procedure. All questions
were addressed. A time out was performed.

The right upper quadrant was interrogated with ultrasound. A
relatively avascular plane of the liver was identified. A suitable
skin entry site was selected and marked. The region was then
sterilely prepped and draped in standard fashion using chlorhexidine
skin prep. Local anesthesia was attained by infiltration with 1%
lidocaine. A small dermatotomy was made.

Under real-time sonographic guidance, a 17 gauge trocar needle was
advanced into the liver. Multiple 18 gauge core biopsies were then
coaxially obtained. Needle placement was confirmed on all biopsy
passes with real-time sonography. Biopsy specimens were placed in
formalin and delivered to pathology for further analysis.

As the introducer needle was removed, the biopsy tract was embolized
with a Gel-Foam slurry. Post biopsy ultrasound imaging demonstrates
no active bleeding or perihepatic hematoma. The patient tolerated
the procedure well.
IMPRESSION: Technically successful ultrasound-guided random core biopsy of the
liver.

## 2020-06-23 MED ORDER — FENTANYL CITRATE (PF) 100 MCG/2ML IJ SOLN
INTRAMUSCULAR | Status: AC | PRN
  Administered 2020-06-23: 50 ug via INTRAVENOUS

## 2020-06-23 MED ORDER — ENOXAPARIN SODIUM 40 MG/0.4ML ~~LOC~~ SOLN: 40.0000 mg | SUBCUTANEOUS | Status: DC

## 2020-06-23 MED ORDER — MIDAZOLAM HCL 2 MG/2ML IJ SOLN
INTRAMUSCULAR | Status: AC
  Filled 2020-06-23: qty 2

## 2020-06-23 MED ORDER — FAMOTIDINE 20 MG PO TABS: 20.0000 mg | ORAL_TABLET | Freq: Every day | ORAL | 0 refills | Status: DC | PRN

## 2020-06-23 MED ORDER — FAMOTIDINE 20 MG PO TABS
20.0000 mg | ORAL_TABLET | Freq: Every day | ORAL | Status: DC
  Administered 2020-06-23: 20 mg via ORAL
  Filled 2020-06-23: qty 1

## 2020-06-23 MED ORDER — INSULIN GLARGINE 100 UNIT/ML ~~LOC~~ SOLN
25.0000 [IU] | Freq: Every day | SUBCUTANEOUS | Status: DC
  Filled 2020-06-23: qty 0.25

## 2020-06-23 MED ORDER — MIDAZOLAM HCL 2 MG/2ML IJ SOLN
INTRAMUSCULAR | Status: AC | PRN
  Administered 2020-06-23: 1 mg via INTRAVENOUS

## 2020-06-23 MED ORDER — CAMPHOR-MENTHOL 0.5-0.5 % EX LOTN: TOPICAL_LOTION | Freq: Two times a day (BID) | CUTANEOUS | 0 refills | Status: DC

## 2020-06-23 MED ORDER — SODIUM CHLORIDE 0.9 % IV BOLUS
500.0000 mL | Freq: Once | INTRAVENOUS | Status: AC
  Administered 2020-06-23: 500 mL via INTRAVENOUS

## 2020-06-23 MED ORDER — VITAMIN D (ERGOCALCIFEROL) 1.25 MG (50000 UNIT) PO CAPS: 50000.0000 [IU] | ORAL_CAPSULE | ORAL | 0 refills | Status: AC

## 2020-06-23 MED ORDER — VITAMIN D (ERGOCALCIFEROL) 1.25 MG (50000 UNIT) PO CAPS
50000.0000 [IU] | ORAL_CAPSULE | ORAL | Status: DC
  Administered 2020-06-23: 50000 [IU] via ORAL
  Filled 2020-06-23: qty 1

## 2020-06-23 MED ORDER — INSULIN GLARGINE 100 UNIT/ML ~~LOC~~ SOLN
30.0000 [IU] | Freq: Every day | SUBCUTANEOUS | Status: DC
  Filled 2020-06-23: qty 0.3

## 2020-06-23 MED ORDER — GELATIN ABSORBABLE 12-7 MM EX MISC
CUTANEOUS | Status: AC
  Filled 2020-06-23: qty 1

## 2020-06-23 MED ORDER — LANTUS SOLOSTAR 100 UNIT/ML ~~LOC~~ SOPN: 30.0000 [IU] | PEN_INJECTOR | Freq: Every day | SUBCUTANEOUS | 11 refills | Status: AC

## 2020-06-23 MED ORDER — LIDOCAINE HCL (PF) 1 % IJ SOLN
INTRAMUSCULAR | Status: AC
  Filled 2020-06-23: qty 30

## 2020-06-23 MED ORDER — FENTANYL CITRATE (PF) 100 MCG/2ML IJ SOLN
INTRAMUSCULAR | Status: AC
  Filled 2020-06-23: qty 2

## 2020-06-23 NOTE — H&P (Signed)
Chief Complaint: Elevated liver enzymes. Request is for liver biopsy  Referring Physician(s): Dr. Chestine Spore  Supervising Physician: Malachy Moan  Patient Status: [facility] - In-pt  History of Present Illness: Joshua Nash is a 55 y.o. male Spanish speaking, inpatient. History of DM, tachycardia, HTN, angina. Presented to the ED at [facility] for [facility] X 2 - 3 months with occassional lightlessness and chest pain., Patient was seen at his cardiologist office where he was found to have elevated liver enzymes and directed to go the emergency room.  Patient found to have acute on CKD.  Team is requesting a liver biopsy for further determination.  Currently without any significant complaints. Patient alert and laying in bed, calm and comfortable. Denies any fevers, headache, chest pain, SOB, cough, abdominal pain, nausea, vomiting or bleeding. Return precautions and treatment recommendations and follow-up discussed with the patient who is agreeable with the plan.   Past Medical History:  Diagnosis Date  . Abnormal electrocardiogram (ECG) (EKG)   . Angina pectoris (HCC) 06/01/2020  . Cardiac mass 06/01/2020  . COVID-19 03/18/2020  . Diabetes mellitus without complication (HCC)   . Elevated liver function tests   . Hypertension   . Post-COVID chronic fatigue   . Tachycardia   . Type 2 diabetes mellitus with proteinuria [facility])     Past Surgical History:  Procedure Laterality Date  . LEG SURGERY  02/2020    Allergies: Patient has no known allergies.  Medications: Prior to Admission medications   Medication Sig Start Date End Date Taking? Authorizing Provider  amLODipine (NORVASC) 10 MG tablet Take 10 mg by mouth daily in the afternoon. 03/28/20 03/28/21 Yes [provider]  empagliflozin (JARDIANCE) 25 MG TABS tablet Take 25 mg by mouth daily.   Yes [provider]  hydrOXYzine (ATARAX/VISTARIL) 25 MG tablet Take 25 mg by mouth 3 (three) times daily as needed for  itching.   Yes [provider]  LANTUS SOLOSTAR 100 UNIT/ML Solostar Pen Inject 40 Units into the skin daily. 06/11/20  Yes [provider]  lisinopril (ZESTRIL) 10 MG tablet Take 10 mg by mouth every morning.   Yes [provider]  nitroGLYCERIN (NITROSTAT) 0.4 MG SL tablet Place 0.4 mg under the tongue every 5 (five) minutes as needed for chest pain.   Yes [provider]     Family History  Problem Relation Age of Onset  . Diabetes Mother   . Hyperlipidemia Mother   . Hypertension Mother   . Diabetes Father   . Hyperlipidemia Father   . Hypertension Father     Social History   Socioeconomic History  . Marital status: Single    Spouse name: Not on file  . Number of children: Not on file  . Years of education: Not on file  . Highest education level: Not on file  Occupational History  . Not on file  Tobacco Use  . Smoking status: Never Smoker  . Smokeless tobacco: Never Used  Vaping Use  . Vaping Use: Never used  Substance and Sexual Activity  . Alcohol use: Yes    Comment: occ  . Drug use: Never  . Sexual activity: Not on file  Other Topics Concern  . Not on file  Social History Narrative  . Not on file   Social Determinants of Health   Financial Resource Strain: Not on file  Food Insecurity: Not on file  Transportation Needs: Not on file  Physical Activity: Not on file  Stress: Not on  file  Social Connections: Not on file     Review of Systems: A 12 point ROS discussed and pertinent positives are indicated in the HPI above.  All other systems are negative.  Review of Systems  Constitutional: Negative for fever.  HENT: Negative for congestion.   Respiratory: Negative for cough and shortness of breath.   Cardiovascular: Negative for chest pain.  Gastrointestinal: Negative for abdominal pain.  Neurological: Negative for headaches.  Psychiatric/Behavioral: Negative for behavioral problems and confusion.    Vital  Signs: BP 128/82 (BP Location: Left Arm)   Pulse 93   Temp 98.9 F (37.2 C) (Oral)   Resp 20   Wt 160 lb 4.8 oz (72.7 kg)   SpO2 100%   BMI 26.68 kg/m   Physical Exam Vitals and nursing note reviewed.  Constitutional:      Appearance: He is well-developed.  HENT:     Head: Normocephalic.  Cardiovascular:     Rate and Rhythm: Normal rate and regular rhythm.     Heart sounds: Normal heart sounds.  Pulmonary:     Effort: Pulmonary effort is normal.  Musculoskeletal:        General: Normal range of motion.     Cervical back: Normal range of motion.  Skin:    General: Skin is dry.  Neurological:     Mental Status: He is alert and oriented to person, place, and time.     Imaging: CT ABDOMEN PELVIS WO CONTRAST  Result Date: 06/19/2020 CLINICAL DATA:  Abdominal pain EXAM: CT ABDOMEN AND PELVIS WITHOUT CONTRAST TECHNIQUE: Multidetector CT imaging of the abdomen and pelvis was performed following the standard protocol without IV contrast. COMPARISON:  None. FINDINGS: Lower chest: No acute abnormality. Hepatobiliary: Liver is within normal limits. Gallbladder demonstrates dense calcification within likely related to contraction and multiple small gallstones. No ductal dilatation is seen. Pancreas: Unremarkable. No pancreatic ductal dilatation or surrounding inflammatory changes. Spleen: Normal in size without focal abnormality. Adrenals/Urinary Tract: Adrenal glands are within normal limits bilaterally. Kidneys show a normal appearance with the exception of small nonobstructing lower pole left renal stone. Ureters are within normal limits bilaterally. No ureteral stones or obstructive changes are noted. The bladder is well distended. Stomach/Bowel: The appendix is within normal limits. No obstructive or inflammatory changes of the colon are seen. Small bowel and stomach appear within normal limits. Vascular/Lymphatic: Aortic atherosclerosis. No enlarged abdominal or pelvic lymph nodes.  Reproductive: Prostate is unremarkable. Other: No abdominal wall hernia or abnormality. No abdominopelvic ascites. Musculoskeletal: Sclerotic focus is noted in the left iliac wing likely related to a bone island. IMPRESSION: Dense calcification within the gallbladder likely related to decompression and multiple small stones within. No ductal dilatation is seen. Nonobstructing lower pole left renal stone. Normal-appearing appendix. Electronically Signed   By: Alcide Clever M.D.   On: 06/19/2020 12:47   DG Chest 2 View  Result Date: 06/19/2020 CLINICAL DATA:  Shortness of breath.  Recent chest pain EXAM: CHEST - 2 VIEW COMPARISON:  None. FINDINGS: Lungs are clear. Heart size and pulmonary vascularity are normal. No adenopathy. No pneumothorax. No bone lesions. IMPRESSION: Lungs clear.  Cardiac silhouette normal. Electronically Signed   By: Bretta Bang III M.D.   On: 06/19/2020 10:38   NM Myocar Multi W/Spect W/Wall Motion / EF  Result Date: 06/22/2020  There was no ST segment deviation noted during stress.  No T wave inversion was noted during stress.  This is a low risk study.  Nuclear stress  EF: 51%.  The left ventricular ejection fraction is mildly decreased (45-54%).  No ischemia or infarction on perfusion images. Normal wall motion.   MR ABDOMEN MRCP W WO CONTAST  Result Date: 06/20/2020 CLINICAL DATA:  Anorexia. Right upper quadrant pain. Nausea. Malaise. Gallstones. 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 EXAM: MRI ABDOMEN WITHOUT AND WITH CONTRAST (INCLUDING MRCP) TECHNIQUE: Multiplanar multisequence MR imaging of the abdomen was performed both before and after the administration of intravenous contrast. Heavily T2-weighted images of the biliary and pancreatic ducts were obtained, and three-dimensional MRCP images were rendered by post processing. CONTRAST:  7.54mL GADAVIST GADOBUTROL 1 MMOL/ML IV SOLN COMPARISON:  06/20/2020 ultrasound and 06/19/2020 CT. FINDINGS: Portions of exam  are mildly motion degraded. Lower chest: Normal heart size without pericardial or pleural effusion. Hepatobiliary: Hepatomegaly, 19.4 cm craniocaudal. No steatosis. No cirrhosis. No focal liver lesion. Multiple small gallstones. No acute cholecystitis or biliary duct dilatation. No choledocholithiasis. Pancreas:  Normal, without mass or ductal dilatation. Spleen:  Normal in size, without focal abnormality. Adrenals/Urinary Tract: Normal adrenal glands. Tiny bilateral renal cysts. No hydronephrosis. Stomach/Bowel: Proximal gastric underdistention. Normal abdominal small bowel loops. Colonic stool burden suggests constipation. Vascular/Lymphatic: Aortic atherosclerosis. No retroperitoneal or retrocrural adenopathy. Other:  No ascites. Musculoskeletal: No acute osseous abnormality. IMPRESSION: 1. Mildly motion degraded exam. 2. Cholelithiasis without acute cholecystitis or biliary duct dilatation. 3. Hepatomegaly. 4. Possible constipation. Electronically Signed   By: Jeronimo Greaves M.D.   On: 06/20/2020 20:16   ECHOCARDIOGRAM COMPLETE  Result Date: 06/20/2020    ECHOCARDIOGRAM REPORT   Patient Name:   AHIJAH DEVERY Date of Exam: 06/20/2020 Medical Rec #:  [id number]             Height:       65.0 in Accession #:    [id number]            Weight:       161.9 lb Date of Birth:  February 16, 1966             BSA:          1.808 m Patient Age:    54 years              BP:           143/88 mmHg Patient Gender: M                     HR:           91 bpm. Exam Location:  Inpatient Procedure: 2D Echo Indications:     Chest Pain R07.9  History:         Patient has no prior history of Echocardiogram examinations.                  Known Aortic Valve Cardiac mass; Risk Factors:Diabetes and                  Hypertension.  Sonographer:     Thurman Coyer RDCS (AE) Referring Phys:  [id number] Manson Passey Diagnosing Phys: Kristeen Miss MD IMPRESSIONS  1. Left ventricular ejection fraction, by estimation, is 50 to 55%. The left  ventricle has low normal function. The left ventricle has no regional wall motion abnormalities. Left ventricular diastolic parameters are consistent with Grade I diastolic dysfunction (impaired relaxation).  2. Right ventricular systolic function is normal. The right ventricular size is normal. There is normal pulmonary artery systolic pressure.  3. The mitral valve is grossly normal. Trivial mitral valve regurgitation.  4. There is a thickened calcification on the LCC. No previous echo is available for review. . The aortic valve is grossly normal. Aortic valve regurgitation is not visualized. FINDINGS  Left Ventricle: Left ventricular ejection fraction, by estimation, is 50 to 55%. The left ventricle has low normal function. The left ventricle has no regional wall motion abnormalities. The left ventricular internal cavity size was normal in size. There is no left ventricular hypertrophy. Left ventricular diastolic parameters are consistent with Grade I diastolic dysfunction (impaired relaxation). Right Ventricle: The right ventricular size is normal. No increase in right ventricular wall thickness. Right ventricular systolic function is normal. There is normal pulmonary artery systolic pressure. The tricuspid regurgitant velocity is 2.20 m/s, and  with an assumed right atrial pressure of 3 mmHg, the estimated right ventricular systolic pressure is 22.4 mmHg. Left Atrium: Left atrial size was normal in size. Right Atrium: Right atrial size was normal in size. Pericardium: There is no evidence of pericardial effusion. Mitral Valve: The mitral valve is grossly normal. Trivial mitral valve regurgitation. Tricuspid Valve: The tricuspid valve is grossly normal. Tricuspid valve regurgitation is trivial. Aortic Valve: There is a thickened calcification on the LCC. No previous echo is available for review. The aortic valve is grossly normal. Aortic valve regurgitation is not visualized. Pulmonic Valve: The pulmonic valve  was normal in structure. Pulmonic valve regurgitation is not visualized. Aorta: The aortic root and ascending aorta are structurally normal, with no evidence of dilitation. IAS/Shunts: The atrial septum is grossly normal.  LEFT VENTRICLE PLAX 2D LVIDd:         4.20 cm  Diastology LVIDs:         2.80 cm  LV e' medial:    4.22 cm/s LV PW:         1.10 cm  LV E/e' medial:  16.5 LV IVS:        1.00 cm  LV e' lateral:   7.70 cm/s LVOT diam:     2.00 cm  LV E/e' lateral: 9.1 LV SV:         46 LV SV Index:   25 LVOT Area:     3.14 cm  RIGHT VENTRICLE RV S prime:     12.90 cm/s TAPSE (M-mode): 2.0 cm LEFT ATRIUM           Index       RIGHT ATRIUM           Index LA diam:      3.50 cm 1.94 cm/m  RA Area:     13.10 cm LA Vol (A2C): 45.8 ml 25.33 ml/m RA Volume:   28.50 ml  15.76 ml/m LA Vol (A4C): 43.6 ml 24.11 ml/m  AORTIC VALVE LVOT Vmax:   99.00 cm/s LVOT Vmean:  54.900 cm/s LVOT VTI:    0.146 m  AORTA Ao Root diam: 3.30 cm Ao Asc diam:  3.40 cm MITRAL VALVE               TRICUSPID VALVE MV Area (PHT): 3.97 cm    TR Peak grad:   19.4 mmHg MV Decel Time: 191 msec    TR Vmax:        220.00 cm/s MV E velocity: 69.80 cm/s MV A velocity: 91.70 cm/s  SHUNTS MV E/A ratio:  0.76        Systemic VTI:  0.15 m  Systemic Diam: 2.00 cm Kristeen Miss MD Electronically signed by Kristeen Miss MD Signature Date/Time: 06/20/2020/12:09:11 PM    Final (Updated)    VAS [place] LOWER EXTREMITY VENOUS (DVT)  Result Date: 06/21/2020  Lower Venous DVT Study Indications: Swelling.  Comparison Study: No prior studies. Performing Technologist: Blanch Media RVS  Examination Guidelines: A complete evaluation includes B-mode imaging, spectral Doppler, color Doppler, and power Doppler as needed of all accessible portions of each vessel. Bilateral testing is considered an integral part of a complete examination. Limited examinations for reoccurring indications may be performed as noted. The reflux portion of the exam is  performed with the patient in reverse Trendelenburg.  +---------+---------------+---------+-----------+----------+--------------+ RIGHT    CompressibilityPhasicitySpontaneityPropertiesThrombus Aging +---------+---------------+---------+-----------+----------+--------------+ CFV      Full           Yes      Yes                                 +---------+---------------+---------+-----------+----------+--------------+ SFJ      Full                                                        +---------+---------------+---------+-----------+----------+--------------+ FV Prox  Full                                                        +---------+---------------+---------+-----------+----------+--------------+ FV Mid   Full                                                        +---------+---------------+---------+-----------+----------+--------------+ FV DistalFull                                                        +---------+---------------+---------+-----------+----------+--------------+ PFV      Full                                                        +---------+---------------+---------+-----------+----------+--------------+ POP      Full           Yes      Yes                                 +---------+---------------+---------+-----------+----------+--------------+ PTV      Full                                                        +---------+---------------+---------+-----------+----------+--------------+  PERO     Full                                                        +---------+---------------+---------+-----------+----------+--------------+   +---------+---------------+---------+-----------+----------+--------------+ LEFT     CompressibilityPhasicitySpontaneityPropertiesThrombus Aging +---------+---------------+---------+-----------+----------+--------------+ CFV      Full           Yes      Yes                                  +---------+---------------+---------+-----------+----------+--------------+ SFJ      Full                                                        +---------+---------------+---------+-----------+----------+--------------+ FV Prox  Full                                                        +---------+---------------+---------+-----------+----------+--------------+ FV Mid   Full                                                        +---------+---------------+---------+-----------+----------+--------------+ FV DistalFull                                                        +---------+---------------+---------+-----------+----------+--------------+ PFV      Full                                                        +---------+---------------+---------+-----------+----------+--------------+ POP      Full           Yes      Yes                                 +---------+---------------+---------+-----------+----------+--------------+ PTV      Full                                                        +---------+---------------+---------+-----------+----------+--------------+ PERO     Full                                                        +---------+---------------+---------+-----------+----------+--------------+  Summary: RIGHT: - There is no evidence of deep vein thrombosis in the lower extremity.  - No cystic structure found in the popliteal fossa.  LEFT: - There is no evidence of deep vein thrombosis in the lower extremity.  - No cystic structure found in the popliteal fossa.  *See table(s) above for measurements and observations. Electronically signed by Sherald Hess MD on 06/21/2020 at 5:45:15 PM.    Final    US Abdomen Limited RUQ (LIVER/GB)  Result Date: 06/20/2020 CLINICAL DATA:  Right upper quadrant abdominal pain EXAM: ULTRASOUND ABDOMEN LIMITED RIGHT UPPER QUADRANT COMPARISON:  None. FINDINGS: Gallbladder: Numerous layering  shadowing calculi are noted within the gallbladder. The gallbladder, however, is not distended, there is no gallbladder wall thickening, and no pericholecystic fluid is identified. There are multiple echogenic foci identified within the gallbladder wall demonstrating ring down artifact compatible with changes of adenomyomatosis. More nodular echogenic focus within the gallbladder fundus demonstrates ring down artifact on image # 18 and also represents adenomyomatosis. Common bile duct: Diameter: 4 mm in mid diameter Liver: No focal lesion identified. Within normal limits in parenchymal echogenicity. Portal vein is patent on color Doppler imaging with normal direction of blood flow towards the liver. Other: None. IMPRESSION: Cholelithiasis without sonographic evidence of acute cholecystitis. Gallbladder adenomyomatosis. Electronically Signed   By: Helyn Numbers MD   On: 06/20/2020 03:05    Labs:  CBC: Recent Labs    06/19/20 1001 06/21/20 0610 06/23/20 0150  WBC 8.5 8.2 8.8  HGB 12.5* 11.7* 11.6*  HCT 40.4 37.4* 36.7*  PLT 335 299 270    COAGS: Recent Labs    06/21/20 0610  INR 0.9    BMP: Recent Labs    06/20/20 0145 06/21/20 0610 06/22/20 0403 06/22/20 1157 06/23/20 0150  NA 134* 136 135  --  131*  K 5.0 4.7 5.4* 4.7 5.7*  CL 109 112* 108  --  107  CO2 19* 20* 21*  --  20*  GLUCOSE 155* 136* 167*  --  274*  BUN 23* 21* 24*  --  32*  CALCIUM 9.2 9.2 9.5  --  8.7*  CREATININE 1.82* 1.52* 1.47*  --  1.81*  GFRNONAA 44* 55* 57*  --  44*    LIVER FUNCTION TESTS: Recent Labs    06/20/20 0145 06/21/20 0610 06/22/20 0403 06/23/20 0150  BILITOT 0.9 0.9 0.8 0.7  AST 65* 34 33 23  ALT 78* 58* 51* 39  ALKPHOS 649* 601* 539* 499*  PROT 6.2* 6.3* 6.2* 6.1*  ALBUMIN 2.5* 2.6* 2.6* 2.6*     Assessment and Plan:  55 y.o. male inpatient. History of DM, tachycardia, HTN, angina. Presented to the ED at [facility] for [facility] X 2 - 3 months with occassional lightlessness and chest  pain., Patient was seen at his cardiologist office where he was found to have elevated liver enzymes and directed to go the emergency room.  Patient found to have acute on CKD.  Team is requesting a liver biopsy for further determination.   Poatassium 5.1 (down trending), Sodium 134, BUN 32, Cr 1.76, albumin 2.6, Alkaline phosphatase 499. AST and ALT WNL. Patient is on subcutaneous prophylactic dose of lovenox. Last dose given on 4.14.22 @ 16:35. NKDA. Patient has been NPO since midnight.  Risks and benefits of liver biopsy was discussed with the patient using a interpretor at bedside. including, but not limited to bleeding, infection, damage to adjacent structures or low yield requiring additional tests.  All of the questions  were answered and there is agreement to proceed.  Consent signed and in chart.   Thank you for this interesting consult.  I greatly enjoyed meeting [name] and look forward to participating in their care.  A copy of this report was sent to the requesting provider on this date.  Electronically Signed: Alene MiresJennifer C Jp Eastham, NP 06/23/2020, 9:06 AM   I spent a total of 40 Minutes    in face to face in clinical consultation, greater than 50% of which was counseling/coordinating care for liver biopsy

## 2020-06-23 NOTE — Discharge Instructions (Signed)
Dear Joshua Nash,   Thank you so much for allowing [place] to be part of your care!  You were admitted to [facility] for abnormal blood work.  Your potassium levels were high, so you received medications to treat this.  You had a normal stress test which shows that your heart is healthy.  You had some scans of your abdomen which showed that you had some gallstones, but nothing concerning.  You had a biopsy of your liver.  The GI doctor will go over the results with you.   POST-HOSPITAL & CARE INSTRUCTIONS 1. Please make an appointment with your primary care doctor within 1 week. 2. Reduce your Lantus to 30 units daily until you see your primary care doctor. 3. Start taking vitamin D once a week as your vitamin D levels were low. 4. Stop taking the lisinopril until your primary doctor says it is okay to restart it. 5. Talk to your doctor about getting a colonoscopy. 6. Please let PCP/Specialists know of any changes that were made.  7. Please see medications section of this packet for any medication changes.   DOCTOR'S APPOINTMENT & FOLLOW UP CARE INSTRUCTIONS  Future Appointments  Date Time Provider Department Center  07/10/2020  8:00 AM MC-MR 1 MC-MRI [facility]  08/25/2020  4:20 PM Revankar, Aundra Dubin, MD CVD-[facility] None    RETURN PRECAUTIONS: Please return to the ED if you develop fever, severe abdominal pain, uncontrollable nausea or vomiting.  Take care and be well!  Family Medicine Teaching Service  [place]  [facility]  [address] [place], [place] [address] [phone]  ------- Blima Singer,  Muchas gracias por permitirnos ser parte de su cuidado! Ingres en [facility] por anlisis de sangre anormales. Sus niveles de potasio eran altos, por lo que recibi medicamentos para tratar esto. Tuviste una prueba de esfuerzo normal que muestra que tu corazn est sano. Le hicieron algunos escneres de su abdomen que mostraron que  tena algunos clculos biliares, pero nada preocupante. Le hicieron una biopsia de hgado. El mdico GI revisar los [place] con usted.   INSTRUCCIONES DE CUIDADO Y POST-HOSPITAL Haga una cita con su mdico de atencin primaria dentro de 1 semana. Reduzca su Lantus a 30 unidades diarias hasta que vea a su mdico de atencin primaria. Comience a tomar vitamina D una vez a la semana ya que sus niveles de vitamina D eran bajos. Deje de tomar lisinopril hasta que su mdico de atencin primaria le diga que puede reiniciarlo. Hable con su mdico acerca de hacerse una colonoscopia. Infrmele al PCP/especialistas sobre cualquier cambio que se haya realizado. Consulte la seccin de medicamentos de este paquete para cualquier [employer].  CITA CON EL MDICO E INSTRUCCIONES DE ATENCIN DE SEGUIMIENTO Citas futuras Marthe [facility] [place] 04/15/2020 8:00 AM MC-MR 1 MC-MRI [facility] 17/08/2020 4:20 p. m. Revankar, Aundra Dubin, MD [facility] [place]   PRECAUCIONES DE DEVOLUCIN: Regrese al servicio de urgencias si presenta fiebre, dolor abdominal intenso, nuseas o vmitos incontrolables.  Cudate y sintete bien!  David Stall de [facility] [place] [address] [place], [place] [address] [phone]

## 2020-06-23 NOTE — Progress Notes (Signed)
Family Medicine Teaching Service Daily Progress Note Intern Pager: [phone]  Patient name: Joshua Nash Medical record number: [id number] Date of birth: May 08, 1965 Age: 55 y.o. Gender: male  Primary Care Provider: Charlott Rakes, MD Consultants: cardiology, GI   Code Status: Full Code  Pt Overview and Major Events to Date:  4/11 admitted 4/14 Myoview Lexiscan - low risk  Assessment and Plan: Joshua Nash is a 55 y.o. male who presents with hyperkalemia, AKI, cholestatic transaminitis, and chest pain. PMHx significant for T2DM, HTN, transaminitis, CKD, post-Covid chronic fatigue.  Chest pain Concern for stable angina based on symptoms; however, pharmacologic stress test yesterday showed normal perfusion.  No further cardiac work-up is recommended. - cardiology consulted, appreciate recommendations  CKD stage IIIa Creatinine back at baseline (1.5-1.7).  Evaluating for nephrotic syndrome though no signs of volume overload clinically. - f/u urine protein/creatinine ratio - holding lisinopril - avoid nephrotoxic agents - monitor on BMP  Hyperkalemia Yesterday, he was mildly hyperkalemic with potassium 5.4 which resolved on recheck without intervention.  Potassium is elevated again this morning with a potassium of 5.7.  Will recheck and intervene as appropriate. - repeat K at 900 - holding lisinopril  T2DM A1c this admission 12.8%, poorly controlled.  Now worsening, it appears patient is now eating more of his meals.  He received 22 units of short acting insulin in the past 24 hours, fasting sugar 293. - increase Lantus 25 units daily - moderate SSI - monitor CBG - hold empagliflozin  HTN Adequately controlled, SBP 110s-140s. - continue amlodipine 10 mg - holding lisinopril  HLD Significantly elevated with total cholesterol 512 and direct LDL 281. Holding off on statin at this time due to transaminitis.  Transaminitis Cholestatic transaminitis with  significant elevated ALP and mild elevation in AST and ALT with significantly elevated GGT. gradually improving, AST and ALT now within normal limits.  Concern for PBC; however, autoimmune labs, including AMA, have been normal so far.  Planning for IR guided liver biopsy per GI to exclude PBC. - monitor on CMP - f/u ANA - GI consulted, appreciated recommendations  Normocytic anemia Mild, stable.  Ferritin elevated which suggest anemia of chronic inflammation.  B12 and folate levels normal. - recommending screening colonoscopy on f/u  Pruritis Unclear etiology, no significant elevation in bilirubin to explain pruritus. Still having pruritis this AM. - f/u bile acids - hydroxyzine TID prn - Sarna cream BID - cholestyramine BID, consider switching to ursodiol if bile acids elevated  FEN/GI: NPO PPx: LMWH  Disposition: med-tele  Subjective:  NAOE.  Feels good overall, no pain currently. Still itchy.  Updated patient with results of stress test and plan for liver biopsy.  Objective: Temp:  [98 F (36.7 C)-98.8 F (37.1 C)] 98 F (36.7 C) (04/15 0342) Pulse Rate:  [84-102] 94 (04/15 0342) Resp:  [11-18] 18 (04/15 0342) BP: (98-175)/(76-104) 137/91 (04/15 0342) SpO2:  [98 %-100 %] 100 % (04/15 0342) Weight:  [72.7 kg] 72.7 kg (04/15 0342) Physical Exam: General: Overweight male laying in bed comfortably, NAD Cardiovascular: RRR, no murmurs Respiratory: CTAB, no respiratory distress Abdomen: Soft, non-tender positive bowel sounds Extremities: WWP, no edema  Laboratory: Recent Labs  Lab 06/19/20 1001 06/21/20 0610 06/23/20 0150  WBC 8.5 8.2 8.8  HGB 12.5* 11.7* 11.6*  HCT 40.4 37.4* 36.7*  PLT 335 299 270   Recent Labs  Lab 06/21/20 0610 06/22/20 0403 06/22/20 1157 06/23/20 0150  NA 136 135  --  131*  K 4.7 5.4* 4.7 5.7*  CL 112* 108  --  107  CO2 20* 21*  --  20*  BUN 21* 24*  --  32*  CREATININE 1.52* 1.47*  --  1.81*  CALCIUM 9.2 9.5  --  8.7*  PROT 6.3*  6.2*  --  6.1*  BILITOT 0.9 0.8  --  0.7  ALKPHOS 601* 539*  --  499*  ALT 58* 51*  --  39  AST 34 33  --  23  GLUCOSE 136* 167*  --  274*     Imaging/Diagnostic Tests: NM Myocar Multi W/Spect W/Wall Motion / EF  Result Date: 06/22/2020  There was no ST segment deviation noted during stress.  No T wave inversion was noted during stress.  This is a low risk study.  Nuclear stress EF: 51%.  The left ventricular ejection fraction is mildly decreased (45-54%).  No ischemia or infarction on perfusion images. Normal wall motion.     Littie Deeds, MD 06/23/2020, 7:30 AM PGY-1, [facility] Family Medicine FPTS Intern pager: [phone], text pages welcome

## 2020-06-23 NOTE — Plan of Care (Signed)
  Problem: Activity: Goal: Risk for activity intolerance will decrease Outcome: Progressing   Problem: Nutrition: Goal: Adequate nutrition will be maintained Outcome: Progressing   Problem: Coping: Goal: Level of anxiety will decrease Outcome: Progressing   Problem: Elimination: Goal: Will not experience complications related to bowel motility Outcome: Progressing Goal: Will not experience complications related to urinary retention Outcome: Progressing   

## 2020-06-23 NOTE — Progress Notes (Signed)
Daily Rounding Note  06/23/2020, 11:16 AM  LOS: 2 days   SUBJECTIVE:   Chief complaint: Elevated LFTs, cholestatic pattern    Pt not in room, down for liver bx currently  OBJECTIVE:         Vital signs in last 24 hours:    Temp:  [98 F (36.7 C)-98.9 F (37.2 C)] 98.9 F (37.2 C) (04/15 0819) Pulse Rate:  [93-102] 93 (04/15 0819) Resp:  [18-20] 20 (04/15 0819) BP: (111-137)/(76-91) 128/82 (04/15 0819) SpO2:  [99 %-100 %] 100 % (04/15 0819) Weight:  [72.7 kg] 72.7 kg (04/15 0342) Last BM Date: 06/21/20 Filed Weights   06/21/20 0500 06/22/20 0442 06/23/20 0342  Weight: 73.7 kg 72.9 kg 72.7 kg   Not seen or examined.    Intake/Output from previous day: 04/14 0701 - 04/15 0700 In: 597 [P.O.:597] Out: 1975 [Urine:1975]  Intake/Output this shift: Total I/O In: -  Out: 200 [Urine:200]  Lab Results: Recent Labs    06/21/20 0610 06/23/20 0150  WBC 8.2 8.8  HGB 11.7* 11.6*  HCT 37.4* 36.7*  PLT 299 270   BMET Recent Labs    06/22/20 0403 06/22/20 1157 06/23/20 0150 06/23/20 0841  NA 135  --  131* 134*  K 5.4* 4.7 5.7* 5.1  CL 108  --  107 109  CO2 21*  --  20* 21*  GLUCOSE 167*  --  274* 299*  BUN 24*  --  32* 32*  CREATININE 1.47*  --  1.81* 1.76*  CALCIUM 9.5  --  8.7* 9.0   LFT Recent Labs    06/21/20 0610 06/22/20 0403 06/23/20 0150  PROT 6.3* 6.2* 6.1*  ALBUMIN 2.6* 2.6* 2.6*  AST 34 33 23  ALT 58* 51* 39  ALKPHOS 601* 539* 499*  BILITOT 0.9 0.8 0.7   PT/INR Recent Labs    06/21/20 0610  LABPROT 11.8  INR 0.9   Hepatitis Panel No results for input(s): HEPBSAG, HCVAB, HEPAIGM, HEPBIGM in the last 72 hours.  Studies/Results: NM Myocar Multi W/Spect W/Wall Motion / EF  Result Date: 06/22/2020  There was no ST segment deviation noted during stress.  No T wave inversion was noted during stress.  This is a low risk study.  Nuclear stress EF: 51%.  The left ventricular  ejection fraction is mildly decreased (45-54%).  No ischemia or infarction on perfusion images. Normal wall motion.   VAS [place] LOWER EXTREMITY VENOUS (DVT)  Result Date: 06/21/2020  Summary: RIGHT: - There is no evidence of deep vein thrombosis in the lower extremity.  - No cystic structure found in the popliteal fossa.  LEFT: - There is no evidence of deep vein thrombosis in the lower extremity.  - No cystic structure found in the popliteal fossa.   Electronically signed by Monica Martinez MD on 06/21/2020 at 5:45:15 PM.    Final     ASSESMENT:   *    Elevated LFTs, cholestatic pattern, abdominal pain, pruritus. CTAP, ultrasound: GB w dense calcification, likely multiple small stones, GB adenomyomatosis, ducts wnl, liver wnl.   MRI/MRCP: Cholelithiasis, no cholecystitis, ducts within normal limits.  Hepatomegaly.  Mildly motion degraded exam. Transaminases, alk phos steadily improving.  T bili never elevated. Ceruloplasmin 31.3 (RR is 16-31).  Alpha 1 antitrypsin normal. IgG normal. Mitochondrial Ab negative. Smooth muscle Ab/ F actin IgG negative.   ANA pndg.    *   Exertional chest pain, dyspnea.  Nonischemic troponins, EKG.  Low risk Lexiscan Myoview 4/14, no further ischemic investigation planned. Possible aortic valve mass, cardiac MRI planned for 5/2.   PLAN   *   Complete liver biopsy today.  Does not need to stay inpt pending reading of liver biopsy. Dr Hilarie Fredrickson to follow him up in the office.  However if he is still inpatient, will discuss findings of liver biopsy w pt.  If liver biopsy negative for PBC, Dr. Hilarie Fredrickson recommends cholecystectomy as gallbladder is clearly abnormal.    Joshua Nash  06/23/2020, 11:16 AM Phone [phone]

## 2020-06-23 NOTE — Discharge Summary (Signed)
[facility] Discharge Summary  Patient name: Joshua Nash Medical record number: [id number] Date of birth: 1965/12/10 Age: 55 y.o. Gender: male Date of Admission: 06/19/2020  Date of Discharge: 06/23/2020  Admitting Physician: Ronnald RampMakiera Simmons-Robinson, MD  Primary Care Provider: Charlott RakesHodges, Francisco, MD Consultants: GI, cardiology, IR  Indication for Hospitalization: Hyperkalemia  Discharge Diagnoses/Problem List:  Principal Problem:   Dyspnea on exertion Active Problems:   Type 2 diabetes mellitus with proteinuria (HCC)   Elevated liver function tests   Hypertension associated with diabetes (HCC)   Chest pain   Cardiac mass   Acute on chronic renal failure (HCC)   CKD stage 3 due to type 2 diabetes mellitus (HCC)   LFTs abnormal   Pruritus   Hepatomegaly   AKI (acute kidney injury) (HCC)   Elevated alkaline phosphatase level   Hyperkalemia   Pruritis   HLD  Disposition: home  Discharge Condition: Stable  Discharge Exam:  General: Overweight male laying in bed comfortably, NAD Cardiovascular: RRR, no murmurs Respiratory: CTAB, no respiratory distress Abdomen: Soft, non-tender, positive bowel sounds Extremities: WWP, no edema   Brief Hospital Course:  Joshua LambVictor Nash is a 55 y.o. male presenting with chest pain, fatigue and hyperglycemia sent to the ED from cardiologist office for abnormal labs. PMH is significant for T2DM, HTN, transaminitis, CKD, post-Covid chronic fatigue, cardiac mass.   Chest pain Patient presented with intermittent anginal pain for the past 3 months being followed by outpatient cardiologist.  ACS work-up in the ED was negative for ACS, troponins were flat and no ischemic changes on EKG.  He had a pharmacologic stress test done during admission which was low risk.  Hyperkalemia K elevated to 6.4 on admission. EKG with borderline peaked T waves.  He was treated in the ED with IV fluids, insulin, beta  agonist, and Lokelma. Repeat K+ improved to 4.8.  He had intermittent hyperkalemia during admission which resolved without treatment, potassium on day of discharge 5.1.  AKI superimposed on CKD stage IIIa Patient has had elevated creatinine with progressively elevation in the last 2 months. On admission Cr is 1.9 and was noted to be 1.6 two months ago. Baseline appears to be 1.5-1.7 though most records are not in our system, estimate he has CKD stage IIIa based creatinine values in our system. FeNa calculated at 1.7%, likely combination of prerenal and intrinsic process.  He was treated with IV fluids with improvement.  Lisinopril and empagliflozin were held during admission.  Creatinine on day of discharge 1.76.  T2DM Patient was started on 20 units of Lantus (half of his home Lantus dose) with sliding scale insulin.  Glucose levels were well controlled though spiked during the 24 hours prior to admission.  He was discharged on 30 units of Lantus.  Transaminitis Upon presentation with significant elevated ALP (777) with mild elevation in AST and ALT (57 and 90, respectively) and significant elevation in GGT (over 1800) suggestive of cholestatic picture. Total bilirubin within normal limits.  Lipase mildly elevated at 79.  CT abdomen revealed gallstones without bile duct dilation. RUQ ultrasound showed adenomyomatosis of the gallbladder.  MRCP was obtained which was largely unremarkable other than cholelithiasis without cholecystitis, normal bile ducts, and hepatomegaly.  Autoimmune liver lab tests (including ANA, ceruloplasmin, mitochondrial antibodies, anti-smooth muscle antibodies, alpha-1 antitrypsin, IgG) were unremarkable.  Due to concern for PBC, liver biopsy was performed on 4/15, results to be followed up by GI.  ALP, AST, and ALT gradually improved throughout admission, ALP 499  on day of discharge with AST and ALT within normal limits.  Cardiac mass Per outpatient notes, he had a TTE which  revealed an area of calcified mass on the aortic valve with unclear etiology.  Patient had a repeat TTE during admission which revealed some calcification of the aortic valve without mass.  No further work-up was recommended by cardiology.  Pruritis Patient was treated with as needed hydroxyzine, Sarna, and cholestyramine during admission.  Patient did not have significant improvement in pruritus.  Bile acids were obtained which were pending at time of discharge.  DC recommendations:  1. Screening colonoscopy. Patient found to have mild anemia with elevated ferritin. Denies hematuria, melena, hematochezia. Never had colonoscopy before.  2. Lisinopril discontinued, consider restarting as appropriate if normal renal function and potassium. 3. Consider starting statin if liver enzymes improve. Confirm cardiology follow up has been scheduled.  4. Repeat BMP to monitor kidney function and potassium. 5. Consider ursodiol if bile acids elevated.      Significant Procedures:  4/14 Lexiscan Myoview 4/15 IR-guided liver biopsy   Significant Labs and Imaging:  Recent Labs  Lab 06/19/20 1001 06/21/20 0610 06/23/20 0150  WBC 8.5 8.2 8.8  HGB 12.5* 11.7* 11.6*  HCT 40.4 37.4* 36.7*  PLT 335 299 270   Recent Labs  Lab 06/19/20 1150 06/19/20 1823 06/20/20 0145 06/21/20 0610 06/22/20 0403 06/22/20 1157 06/23/20 0150 06/23/20 0841  NA  --  136 134* 136 135  --  131* 134*  K  --  4.8 5.0 4.7 5.4*   < > 5.7* 5.1  CL  --  106 109 112* 108  --  107 109  CO2  --  23 19* 20* 21*  --  20* 21*  GLUCOSE  --  100* 155* 136* 167*  --  274* 299*  BUN  --  25* 23* 21* 24*  --  32* 32*  CREATININE  --  1.63* 1.82* 1.52* 1.47*  --  1.81* 1.76*  CALCIUM  --  10.1 9.2 9.2 9.5  --  8.7* 9.0  MG  --  2.3  --   --   --   --   --   --   PHOS  --  4.0  --   --   --   --   --   --   ALKPHOS 777*  --  649* 601* 539*  --  499*  --   AST 57*  --  65* 34 33  --  23  --   ALT 90*  --  78* 58* 51*  --  39  --    ALBUMIN 3.0*  --  2.5* 2.6* 2.6*  --  2.6*  --    < > = values in this interval not displayed.    CT ABDOMEN PELVIS WO CONTRAST  Result Date: 06/19/2020 CLINICAL DATA:  Abdominal pain EXAM: CT ABDOMEN AND PELVIS WITHOUT CONTRAST TECHNIQUE: Multidetector CT imaging of the abdomen and pelvis was performed following the standard protocol without IV contrast. COMPARISON:  None. FINDINGS: Lower chest: No acute abnormality. Hepatobiliary: Liver is within normal limits. Gallbladder demonstrates dense calcification within likely related to contraction and multiple small gallstones. No ductal dilatation is seen. Pancreas: Unremarkable. No pancreatic ductal dilatation or surrounding inflammatory changes. Spleen: Normal in size without focal abnormality. Adrenals/Urinary Tract: Adrenal glands are within normal limits bilaterally. Kidneys show a normal appearance with the exception of small nonobstructing lower pole left renal stone. Ureters are within normal limits  bilaterally. No ureteral stones or obstructive changes are noted. The bladder is well distended. Stomach/Bowel: The appendix is within normal limits. No obstructive or inflammatory changes of the colon are seen. Small bowel and stomach appear within normal limits. Vascular/Lymphatic: Aortic atherosclerosis. No enlarged abdominal or pelvic lymph nodes. Reproductive: Prostate is unremarkable. Other: No abdominal wall hernia or abnormality. No abdominopelvic ascites. Musculoskeletal: Sclerotic focus is noted in the left iliac wing likely related to a bone island. IMPRESSION: Dense calcification within the gallbladder likely related to decompression and multiple small stones within. No ductal dilatation is seen. Nonobstructing lower pole left renal stone. Normal-appearing appendix. Electronically Signed   By: Alcide Clever M.D.   On: 06/19/2020 12:47   DG Chest 2 View  Result Date: 06/19/2020 CLINICAL DATA:  Shortness of breath.  Recent chest pain EXAM: CHEST  - 2 VIEW COMPARISON:  None. FINDINGS: Lungs are clear. Heart size and pulmonary vascularity are normal. No adenopathy. No pneumothorax. No bone lesions. IMPRESSION: Lungs clear.  Cardiac silhouette normal. Electronically Signed   By: Bretta Bang III M.D.   On: 06/19/2020 10:38   NM Myocar Multi W/Spect W/Wall Motion / EF  Result Date: 06/22/2020  There was no ST segment deviation noted during stress.  No T wave inversion was noted during stress.  This is a low risk study.  Nuclear stress EF: 51%.  The left ventricular ejection fraction is mildly decreased (45-54%).  No ischemia or infarction on perfusion images. Normal wall motion.   US BIOPSY (LIVER)  Result Date: 06/23/2020 INDICATION: 55 year old male with elevated LFTs. He presents for random liver biopsy. EXAM: Ultrasound-guided core biopsy of the liver Interventional Radiologist:  Sterling Big, MD MEDICATIONS: None. ANESTHESIA/SEDATION: Fentanyl 50 mcg IV; Versed 1 mg IV Moderate Sedation Time:  10 minutes The patient was continuously monitored during the procedure by the interventional radiology nurse under my direct supervision. FLUOROSCOPY TIME:  None. COMPLICATIONS: None immediate. PROCEDURE: Informed consent was obtained from the patient following explanation of the procedure, risks, benefits and alternatives. The patient understands, agrees and consents for the procedure. All questions were addressed. A time out was performed. The right upper quadrant was interrogated with ultrasound. A relatively avascular plane of the liver was identified. A suitable skin entry site was selected and marked. The region was then sterilely prepped and draped in standard fashion using chlorhexidine skin prep. Local anesthesia was attained by infiltration with 1% lidocaine. A small dermatotomy was made. Under real-time sonographic guidance, a 17 gauge trocar needle was advanced into the liver. Multiple 18 gauge core biopsies were then coaxially  obtained. Needle placement was confirmed on all biopsy passes with real-time sonography. Biopsy specimens were placed in formalin and delivered to pathology for further analysis. As the introducer needle was removed, the biopsy tract was embolized with a Gel-Foam slurry. Post biopsy ultrasound imaging demonstrates no active bleeding or perihepatic hematoma. The patient tolerated the procedure well. IMPRESSION: Technically successful ultrasound-guided random core biopsy of the liver. Electronically Signed   By: Malachy Moan M.D.   On: 06/23/2020 13:34   MR ABDOMEN MRCP W WO CONTAST  Result Date: 06/20/2020 CLINICAL DATA:  Anorexia. Right upper quadrant pain. Nausea. Malaise. Gallstones. 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 EXAM: MRI ABDOMEN WITHOUT AND WITH CONTRAST (INCLUDING MRCP) TECHNIQUE: Multiplanar multisequence MR imaging of the abdomen was performed both before and after the administration of intravenous contrast. Heavily T2-weighted images of the biliary and pancreatic ducts were obtained, and three-dimensional MRCP images were rendered  by post processing. CONTRAST:  7.58mL GADAVIST GADOBUTROL 1 MMOL/ML IV SOLN COMPARISON:  06/20/2020 ultrasound and 06/19/2020 CT. FINDINGS: Portions of exam are mildly motion degraded. Lower chest: Normal heart size without pericardial or pleural effusion. Hepatobiliary: Hepatomegaly, 19.4 cm craniocaudal. No steatosis. No cirrhosis. No focal liver lesion. Multiple small gallstones. No acute cholecystitis or biliary duct dilatation. No choledocholithiasis. Pancreas:  Normal, without mass or ductal dilatation. Spleen:  Normal in size, without focal abnormality. Adrenals/Urinary Tract: Normal adrenal glands. Tiny bilateral renal cysts. No hydronephrosis. Stomach/Bowel: Proximal gastric underdistention. Normal abdominal small bowel loops. Colonic stool burden suggests constipation. Vascular/Lymphatic: Aortic atherosclerosis. No retroperitoneal or retrocrural  adenopathy. Other:  No ascites. Musculoskeletal: No acute osseous abnormality. IMPRESSION: 1. Mildly motion degraded exam. 2. Cholelithiasis without acute cholecystitis or biliary duct dilatation. 3. Hepatomegaly. 4. Possible constipation. Electronically Signed   By: Jeronimo Greaves M.D.   On: 06/20/2020 20:16   ECHOCARDIOGRAM COMPLETE  Result Date: 06/20/2020    ECHOCARDIOGRAM REPORT   Patient Name:   SARGENT MANKEY Date of Exam: 06/20/2020 Medical Rec #:  [id number]             Height:       65.0 in Accession #:    [id number]            Weight:       161.9 lb Date of Birth:  [birth year]             BSA:          1.808 m Patient Age:    54 years              BP:           143/88 mmHg Patient Gender: M                     HR:           91 bpm. Exam Location:  Inpatient Procedure: 2D Echo Indications:     Chest Pain R07.9  History:         Patient has no prior history of Echocardiogram examinations.                  Known Aortic Valve Cardiac mass; Risk Factors:Diabetes and                  Hypertension.  Sonographer:     Thurman Coyer RDCS (AE) Referring Phys:  [id number] Manson Passey Diagnosing Phys: Kristeen Miss MD IMPRESSIONS  1. Left ventricular ejection fraction, by estimation, is 50 to 55%. The left ventricle has low normal function. The left ventricle has no regional wall motion abnormalities. Left ventricular diastolic parameters are consistent with Grade I diastolic dysfunction (impaired relaxation).  2. Right ventricular systolic function is normal. The right ventricular size is normal. There is normal pulmonary artery systolic pressure.  3. The mitral valve is grossly normal. Trivial mitral valve regurgitation.  4. There is a thickened calcification on the LCC. No previous echo is available for review. . The aortic valve is grossly normal. Aortic valve regurgitation is not visualized. FINDINGS  Left Ventricle: Left ventricular ejection fraction, by estimation, is 50 to 55%. The left ventricle  has low normal function. The left ventricle has no regional wall motion abnormalities. The left ventricular internal cavity size was normal in size. There is no left ventricular hypertrophy. Left ventricular diastolic parameters are consistent with Grade I diastolic dysfunction (impaired relaxation). Right Ventricle: The right ventricular size is normal.  No increase in right ventricular wall thickness. Right ventricular systolic function is normal. There is normal pulmonary artery systolic pressure. The tricuspid regurgitant velocity is 2.20 m/s, and  with an assumed right atrial pressure of 3 mmHg, the estimated right ventricular systolic pressure is 22.4 mmHg. Left Atrium: Left atrial size was normal in size. Right Atrium: Right atrial size was normal in size. Pericardium: There is no evidence of pericardial effusion. Mitral Valve: The mitral valve is grossly normal. Trivial mitral valve regurgitation. Tricuspid Valve: The tricuspid valve is grossly normal. Tricuspid valve regurgitation is trivial. Aortic Valve: There is a thickened calcification on the LCC. No previous echo is available for review. The aortic valve is grossly normal. Aortic valve regurgitation is not visualized. Pulmonic Valve: The pulmonic valve was normal in structure. Pulmonic valve regurgitation is not visualized. Aorta: The aortic root and ascending aorta are structurally normal, with no evidence of dilitation. IAS/Shunts: The atrial septum is grossly normal.  LEFT VENTRICLE PLAX 2D LVIDd:         4.20 cm  Diastology LVIDs:         2.80 cm  LV e' medial:    4.22 cm/s LV PW:         1.10 cm  LV E/e' medial:  16.5 LV IVS:        1.00 cm  LV e' lateral:   7.70 cm/s LVOT diam:     2.00 cm  LV E/e' lateral: 9.1 LV SV:         46 LV SV Index:   25 LVOT Area:     3.14 cm  RIGHT VENTRICLE RV S prime:     12.90 cm/s TAPSE (M-mode): 2.0 cm LEFT ATRIUM           Index       RIGHT ATRIUM           Index LA diam:      3.50 cm 1.94 cm/m  RA Area:      13.10 cm LA Vol (A2C): 45.8 ml 25.33 ml/m RA Volume:   28.50 ml  15.76 ml/m LA Vol (A4C): 43.6 ml 24.11 ml/m  AORTIC VALVE LVOT Vmax:   99.00 cm/s LVOT Vmean:  54.900 cm/s LVOT VTI:    0.146 m  AORTA Ao Root diam: 3.30 cm Ao Asc diam:  3.40 cm MITRAL VALVE               TRICUSPID VALVE MV Area (PHT): 3.97 cm    TR Peak grad:   19.4 mmHg MV Decel Time: 191 msec    TR Vmax:        220.00 cm/s MV E velocity: 69.80 cm/s MV A velocity: 91.70 cm/s  SHUNTS MV E/A ratio:  0.76        Systemic VTI:  0.15 m                            Systemic Diam: 2.00 cm Kristeen Miss MD Electronically signed by Kristeen Miss MD Signature Date/Time: 06/20/2020/12:09:11 PM    Final (Updated)    VAS [place] LOWER EXTREMITY VENOUS (DVT)  Result Date: 06/21/2020  Lower Venous DVT Study Indications: Swelling.  Comparison Study: No prior studies. Performing Technologist: Blanch Media RVS  Examination Guidelines: A complete evaluation includes B-mode imaging, spectral Doppler, color Doppler, and power Doppler as needed of all accessible portions of each vessel. Bilateral testing is considered an integral part of a complete examination.  Limited examinations for reoccurring indications may be performed as noted. The reflux portion of the exam is performed with the patient in reverse Trendelenburg.  +---------+---------------+---------+-----------+----------+--------------+ RIGHT    CompressibilityPhasicitySpontaneityPropertiesThrombus Aging +---------+---------------+---------+-----------+----------+--------------+ CFV      Full           Yes      Yes                                 +---------+---------------+---------+-----------+----------+--------------+ SFJ      Full                                                        +---------+---------------+---------+-----------+----------+--------------+ FV Prox  Full                                                         +---------+---------------+---------+-----------+----------+--------------+ FV Mid   Full                                                        +---------+---------------+---------+-----------+----------+--------------+ FV DistalFull                                                        +---------+---------------+---------+-----------+----------+--------------+ PFV      Full                                                        +---------+---------------+---------+-----------+----------+--------------+ POP      Full           Yes      Yes                                 +---------+---------------+---------+-----------+----------+--------------+ PTV      Full                                                        +---------+---------------+---------+-----------+----------+--------------+ PERO     Full                                                        +---------+---------------+---------+-----------+----------+--------------+   +---------+---------------+---------+-----------+----------+--------------+ LEFT     CompressibilityPhasicitySpontaneityPropertiesThrombus Aging +---------+---------------+---------+-----------+----------+--------------+ CFV      Full  Yes      Yes                                 +---------+---------------+---------+-----------+----------+--------------+ SFJ      Full                                                        +---------+---------------+---------+-----------+----------+--------------+ FV Prox  Full                                                        +---------+---------------+---------+-----------+----------+--------------+ FV Mid   Full                                                        +---------+---------------+---------+-----------+----------+--------------+ FV DistalFull                                                         +---------+---------------+---------+-----------+----------+--------------+ PFV      Full                                                        +---------+---------------+---------+-----------+----------+--------------+ POP      Full           Yes      Yes                                 +---------+---------------+---------+-----------+----------+--------------+ PTV      Full                                                        +---------+---------------+---------+-----------+----------+--------------+ PERO     Full                                                        +---------+---------------+---------+-----------+----------+--------------+     Summary: RIGHT: - There is no evidence of deep vein thrombosis in the lower extremity.  - No cystic structure found in the popliteal fossa.  LEFT: - There is no evidence of deep vein thrombosis in the lower extremity.  - No cystic structure found in the popliteal fossa.  *See table(s) above for measurements and observations. Electronically signed by Sherald Hess MD on 06/21/2020 at 5:45:15  PM.    Final    US Abdomen Limited RUQ (LIVER/GB)  Result Date: 06/20/2020 CLINICAL DATA:  Right upper quadrant abdominal pain EXAM: ULTRASOUND ABDOMEN LIMITED RIGHT UPPER QUADRANT COMPARISON:  None. FINDINGS: Gallbladder: Numerous layering shadowing calculi are noted within the gallbladder. The gallbladder, however, is not distended, there is no gallbladder wall thickening, and no pericholecystic fluid is identified. There are multiple echogenic foci identified within the gallbladder wall demonstrating ring down artifact compatible with changes of adenomyomatosis. More nodular echogenic focus within the gallbladder fundus demonstrates ring down artifact on image # 18 and also represents adenomyomatosis. Common bile duct: Diameter: 4 mm in mid diameter Liver: No focal lesion identified. Within normal limits in parenchymal echogenicity. Portal vein  is patent on color Doppler imaging with normal direction of blood flow towards the liver. Other: None. IMPRESSION: Cholelithiasis without sonographic evidence of acute cholecystitis. Gallbladder adenomyomatosis. Electronically Signed   By: Helyn Numbers MD   On: 06/20/2020 03:05     Results/Tests Pending at Time of Discharge:  Unresulted Labs (From admission, onward)          Start     Ordered   06/23/20 0500  Bile acids, total  Tomorrow morning,   R       Question:  Specimen collection method  Answer:  Lab=Lab collect   06/22/20 1123   06/22/20 0500  Comprehensive metabolic panel  Daily,   R     Question:  Specimen collection method  Answer:  Lab=Lab collect   06/21/20 1807   06/20/20 0834  Protein / creatinine ratio, urine  Once,   R        06/20/20 1610        Liver biopsy pathology  Discharge Medications:  Allergies as of 06/23/2020   No Known Allergies     Medication List    STOP taking these medications   lisinopril 10 MG tablet Commonly known as: ZESTRIL     TAKE these medications   amLODipine 10 MG tablet Commonly known as: NORVASC Take 10 mg by mouth daily in the afternoon.   camphor-menthol lotion Commonly known as: SARNA Apply topically 2 (two) times daily.   empagliflozin 25 MG Tabs tablet Commonly known as: JARDIANCE Take 25 mg by mouth daily.   famotidine 20 MG tablet Commonly known as: PEPCID Take 1 tablet (20 mg total) by mouth daily as needed for heartburn or indigestion.   hydrOXYzine 25 MG tablet Commonly known as: ATARAX/VISTARIL Take 25 mg by mouth 3 (three) times daily as needed for itching.   Lantus SoloStar 100 UNIT/ML Solostar Pen Generic drug: insulin glargine Inject 30 Units into the skin daily. What changed: how much to take   nitroGLYCERIN 0.4 MG SL tablet Commonly known as: NITROSTAT Place 0.4 mg under the tongue every 5 (five) minutes as needed for chest pain.   Vitamin D (Ergocalciferol) 1.25 MG (50000 UNIT) Caps  capsule Commonly known as: DRISDOL Take 1 capsule (50,000 Units total) by mouth every 7 (seven) days.            Discharge Care Instructions  (From admission, onward)         Start     Ordered   06/23/20 0000  If the dressing is still on your incision site when you go home, remove it on the third day after your surgery date. Remove dressing if it begins to fall off, or if it is dirty or damaged before the third day.  06/23/20 1453          Discharge Instructions: Please refer to Patient Instructions section of EMR for full details.  Patient was counseled important signs and symptoms that should prompt return to medical care, changes in medications, dietary instructions, activity restrictions, and follow up appointments.   Follow-Up Appointments:  Follow-up Information    Charlott Rakes, MD.   Specialty: Family Medicine Why: Please follow up in a week Contact information: [address] [place] [address] [phone]               Littie Deeds, MD 06/23/2020, 3:52 PM PGY-1, [facility] Family Medicine

## 2020-06-23 NOTE — Progress Notes (Signed)
Inpatient Diabetes Program Recommendations  AACE/ADA: New Consensus Statement on Inpatient Glycemic Control (2015)  Target Ranges:  Prepandial:   less than 140 mg/dL      Peak postprandial:   less than 180 mg/dL (1-2 hours)      Critically ill patients:  140 - 180 mg/dL   Lab Results  Component Value Date   GLUCAP 293 (H) 06/23/2020   HGBA1C 12.8 (H) 06/19/2020    Review of Glycemic Control  Diabetes history: type 2 Outpatient Diabetes medications: Lantus 40 units daily, Jardiance 25 mg daily Current orders for Inpatient glycemic control: Lantus 25 units daily, Novolog MODERATE correction scale TID  Inpatient Diabetes Program Recommendations:   Noted that blood sugars have been greater than 200 mg/dl.   Noted that Lantus has been increased to 25 units daily. Recommend adding Novolog 3 units TID as meal coverage if blood sugars continue to be elevated and patient eats at least 50% of meal.  Smith Mince RN BSN CDE Diabetes Coordinator Pager: [phone]  8am-5pm

## 2020-06-23 NOTE — Procedures (Signed)
Interventional Radiology Procedure Note  Procedure: US guided random liver biopsy  Complications: None  Estimated Blood Loss: None  Recommendations: - Path sent - Bedrest x 2 hrs  Signed,  Molly Maselli K. Aahna Rossa, MD    

## 2020-06-23 NOTE — Progress Notes (Signed)
Progress Note  Patient Name: Joshua Nash Date of Encounter: 06/23/2020  [facility] Cardiologist: Jenean Lindau, MD   Subjective   Denies chest pain or dyspnea this morning.  Inpatient Medications    Scheduled Meds: . amLODipine  10 mg Oral Daily  . camphor-menthol   Topical BID  . cholestyramine  4 g Oral BID  . enoxaparin (LOVENOX) injection  40 mg Subcutaneous Q24H  . famotidine  20 mg Oral Daily  . insulin aspart  0-15 Units Subcutaneous TID WC  . insulin glargine  30 Units Subcutaneous QHS  . Vitamin D (Ergocalciferol)  50,000 Units Oral Q7 days   Continuous Infusions: . sodium chloride     PRN Meds: acetaminophen **OR** acetaminophen, hydrOXYzine, nitroGLYCERIN   Vital Signs    Vitals:   06/22/20 1635 06/22/20 1945 06/23/20 0342 06/23/20 0819  BP: (P) 138/82 111/76 (!) 137/91 128/82  Pulse:  (!) 102 94 93  Resp:  _0 Temp: (P) 98.5 F (36.9 C) 98.8 F (37.1 C) 98 F (36.7 C) 98.9 F (37.2 C)  TempSrc: (P) Oral Oral Oral Oral  SpO2:  99% 100% 100%  Weight:   72.7 kg     Intake/Output Summary (Last 24 hours) at 06/23/2020 1016 Last data filed at 06/23/2020 0819 Gross per 24 hour  Intake 597 ml  Output 2175 ml  Net -1578 ml   Last 3 Weights 06/23/2020 06/22/2020 06/21/2020  Weight (lbs) 160 lb 4.8 oz 160 lb 12.8 oz 162 lb 7.7 oz  Weight (kg) 72.712 kg 72.938 kg 73.7 kg      Telemetry    Unable to review telemetry down in nuclear medicine but remained in sinus rhythm throughout [employer] study. - Personally Reviewed  ECG    No new ECG tracing today. - Personally Reviewed  Physical Exam   GEN: No acute distress.   Neck: No JVD. Cardiac: RRR. No murmurs, rubs, or gallops. Radial pulses 2+ and equal bilaterally. Respiratory: Clear to auscultation bilaterally. No wheezes, rhonchi, or rales. GI: Soft and non-distended  MS: No lower extremity edema. No deformity. Skin: Warm and dry. Neuro:  No focal deficits. Psych:  Normal affect. Responds appropriately.  Labs    High Sensitivity Troponin:   Recent Labs  Lab 06/19/20 1001 06/19/20 1150  TROPONINIHS 7 6      Chemistry Recent Labs  Lab 06/21/20 0610 06/22/20 0403 06/22/20 1157 06/23/20 0150 06/23/20 0841  NA 136 135  --  131* 134*  K 4.7 5.4* 4.7 5.7* 5.1  CL 112* 108  --  107 109  CO2 20* 21*  --  20* 21*  GLUCOSE 136* 167*  --  274* 299*  BUN 21* 24*  --  32* 32*  CREATININE 1.52* 1.47*  --  1.81* 1.76*  CALCIUM 9.2 9.5  --  8.7* 9.0  PROT 6.3* 6.2*  --  6.1*  --   ALBUMIN 2.6* 2.6*  --  2.6*  --   AST 34 33  --  23  --   ALT 58* 51*  --  39  --   ALKPHOS 601* 539*  --  499*  --   BILITOT 0.9 0.8  --  0.7  --   GFRNONAA 55* 57*  --  44* 46*  ANIONGAP 4* 6  --  4* 4*     Hematology Recent Labs  Lab 06/19/20 1001 06/21/20 0610 06/23/20 0150  WBC 8.5 8.2 8.8  RBC 4.98 4.70 4.60  HGB  12.5* 11.7* 11.6*  HCT 40.4 37.4* 36.7*  MCV 81.1 79.6* 79.8*  MCH 25.1* 24.9* 25.2*  MCHC 30.9 31.3 31.6  RDW 17.4* 17.2* 17.1*  PLT 335 299 270    BNP Recent Labs  Lab 06/19/20 1823  BNP 13.6     DDimer No results for input(s): DDIMER in the last 168 hours.   Radiology    NM Myocar Multi W/Spect W/Wall Motion / EF  Result Date: 06/22/2020  There was no ST segment deviation noted during stress.  No T wave inversion was noted during stress.  This is a low risk study.  Nuclear stress EF: 51%.  The left ventricular ejection fraction is mildly decreased (45-54%).  No ischemia or infarction on perfusion images. Normal wall motion.   VAS [place] LOWER EXTREMITY VENOUS (DVT)  Result Date: 06/21/2020  Lower Venous DVT Study Indications: Swelling.  Comparison Study: No prior studies. Performing Technologist: Abram Sander RVS  Examination Guidelines: A complete evaluation includes B-mode imaging, spectral Doppler, color Doppler, and power Doppler as needed of all accessible portions of each vessel. Bilateral testing is considered an  integral part of a complete examination. Limited examinations for reoccurring indications may be performed as noted. The reflux portion of the exam is performed with the patient in reverse Trendelenburg.  +---------+---------------+---------+-----------+----------+--------------+ RIGHT    CompressibilityPhasicitySpontaneityPropertiesThrombus Aging +---------+---------------+---------+-----------+----------+--------------+ CFV      Full           Yes      Yes                                 +---------+---------------+---------+-----------+----------+--------------+ SFJ      Full                                                        +---------+---------------+---------+-----------+----------+--------------+ FV Prox  Full                                                        +---------+---------------+---------+-----------+----------+--------------+ FV Mid   Full                                                        +---------+---------------+---------+-----------+----------+--------------+ FV DistalFull                                                        +---------+---------------+---------+-----------+----------+--------------+ PFV      Full                                                        +---------+---------------+---------+-----------+----------+--------------+ POP      Full  Yes      Yes                                 +---------+---------------+---------+-----------+----------+--------------+ PTV      Full                                                        +---------+---------------+---------+-----------+----------+--------------+ PERO     Full                                                        +---------+---------------+---------+-----------+----------+--------------+   +---------+---------------+---------+-----------+----------+--------------+ LEFT     CompressibilityPhasicitySpontaneityPropertiesThrombus  Aging +---------+---------------+---------+-----------+----------+--------------+ CFV      Full           Yes      Yes                                 +---------+---------------+---------+-----------+----------+--------------+ SFJ      Full                                                        +---------+---------------+---------+-----------+----------+--------------+ FV Prox  Full                                                        +---------+---------------+---------+-----------+----------+--------------+ FV Mid   Full                                                        +---------+---------------+---------+-----------+----------+--------------+ FV DistalFull                                                        +---------+---------------+---------+-----------+----------+--------------+ PFV      Full                                                        +---------+---------------+---------+-----------+----------+--------------+ POP      Full           Yes      Yes                                 +---------+---------------+---------+-----------+----------+--------------+ PTV  Full                                                        +---------+---------------+---------+-----------+----------+--------------+ PERO     Full                                                        +---------+---------------+---------+-----------+----------+--------------+     Summary: RIGHT: - There is no evidence of deep vein thrombosis in the lower extremity.  - No cystic structure found in the popliteal fossa.  LEFT: - There is no evidence of deep vein thrombosis in the lower extremity.  - No cystic structure found in the popliteal fossa.  *See table(s) above for measurements and observations. Electronically signed by Monica Martinez MD on 06/21/2020 at 5:45:15 PM.    Final     Cardiac Studies   Echocardiogram 06/20/2020: Impressions: 1. Left  ventricular ejection fraction, by estimation, is 50 to 55%. The  left ventricle has low normal function. The left ventricle has no regional  wall motion abnormalities. Left ventricular diastolic parameters are  consistent with Grade I diastolic  dysfunction (impaired relaxation).  2. Right ventricular systolic function is normal. The right ventricular  size is normal. There is normal pulmonary artery systolic pressure.  3. The mitral valve is grossly normal. Trivial mitral valve  regurgitation.  4. There is a thickened calcification on the LCC. No previous echo is  available for review. The aortic valve is grossly normal. Aortic valve  regurgitation is not visualized.   Patient Profile     55 y.o. 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   Assessment & Plan    Exertional Chest Pain and Dyspnea - Symptoms concerning for angina. Outpatient coronary CTA had been ordered and was scheduled for next month.  - EKG shows no acute ischemic changes. - High-sensitivity troponin negative x2. - Echo showed LVEF of 50-55% with normal wall motion and grade 1 diastolic dysfunction. - Continue to report some chest tightness with activity. - Patient underwent Lexiscan Myoview yesterday, which was low risk.  No further cardiac work up recommended  Possible Aortic Valve Mass - Questionable aortic valve mass noted on recent Echo at outpatient hospital. Cardiac MRI pending.  - Echo this admission shows thickened calcification on the left coronary cusp of the aortic valve but no mass seen. - Blood cultures here negative. - No further work-up planned.  Abdominal Pain - Patient continues to complain of abdominal pain.  - LFT's elevated but slowly improving. Today: AST 33, ALT 51, Alk Phos 539, Total  Bili 0.8. - Lipase elevated at 79. - Right upper quadrant ultrasound showed cholelithiasis but no evidence of cholecystitis.  - GI consulted. MRCP overall unremarkable except for hepatomegaly and cholelithiasis without ductal dilation or concern for cholecystitis.  - Management per primary team and GI.  Hypertension - Systolic BP dropped from 381 to 98 after administration of Lexiscan. BP has now returned to baseline  in the 130's-140's/70's-80's. - Continue Amlodipine 74m daily for now.  Hyperlipidemia - Lipid panel this admission: Total Cholesterol 512, Triglycerides 474, HDL 51. Direct LDL 281.  - Statin has not been started yet due to transaminitis. Recommend starting high-intensity statin if LFTs improve. - May need referral to Dr. [facility] given significantly elevated total cholesterol and LDL.  Acute on CKD stage III - Creatinine 1.9 on admission. Currently 1.8. - Home Lisinopril on hold. - Continue to monitor daily BMETs.  Hyperkalemia - Potassium 5.y this morning - Management per primary team.  Otherwise, per primary team. - Anemia - Pruritis - Type 2 diabetes mellitus   [facility] will sign off.   Medication Recommendations:  No changes Other recommendations (labs, testing, etc):  None Follow up as an outpatient:  F/u scheduled with Dr RGeraldo Pitter6/17/22   For questions or updates, please contact [facility] Please consult [email or web address] for contact info under        Signed, CDonato Heinz MD  06/23/2020, 10:16 AM

## 2020-06-24 LAB — BILE ACIDS, TOTAL: Bile Acids Total: 13.3 umol/L — ABNORMAL HIGH (ref 0.0–10.0)

## 2020-06-25 LAB — CULTURE, BLOOD (ROUTINE X 2)
Culture: NO GROWTH
Culture: NO GROWTH
Special Requests: ADEQUATE
Special Requests: ADEQUATE

## 2020-06-26 ENCOUNTER — Telehealth: Payer: Self-pay

## 2020-06-26 LAB — SURGICAL PATHOLOGY

## 2020-06-26 NOTE — Telephone Encounter (Signed)
Pt scheduled to see Dr. Rhea Belton 07/24/20@9 :30am. Appt letter mailed to pt.

## 2020-06-26 NOTE — Telephone Encounter (Signed)
-----   Message from Beverley Fiedler, MD sent at 06/23/2020  3:34 PM EDT ----- Pt needs follow-up visit with me, ideally in the next 3-4 weeks.  APP would be ok, preferably Amy as she knows him. He has a liver bx pending and when I see that I will have further recs. He is leaving hospital today Thanks JMP

## 2020-06-29 ENCOUNTER — Telehealth: Payer: Self-pay | Admitting: *Deleted

## 2020-06-29 DIAGNOSIS — R7989 Other specified abnormal findings of blood chemistry: Secondary | ICD-10-CM

## 2020-06-29 NOTE — Telephone Encounter (Signed)
Lab orders have been placed in Epic. Joshua Nash, [facility] has spoken to patient to advise him of Dr Lauro Franklin recommendations and interpretation of results and patient verbalizes understanding. He is also made aware that he should come for labs next week.

## 2020-06-29 NOTE — Telephone Encounter (Signed)
-----   Message from Beverley Fiedler, MD sent at 06/29/2020  9:06 AM EDT ----- Joshua Nash Please contact the patient with the following info; he is Spanish speaking; --liver biopsy done to eval for autoimmune liver disease called PBC --No evidence of PBC --the liver injury looks like a medication side effect and this is good news for him because this should get better on its own.  This should not be a chronic problem for him.  I do want to follow liver labs and make sure this gets completely better.  He has an appt with me in May which he should keep --please see if he can come for labs (hepatic function panel, CBC, and INR next week)  Thanks  [employer] orders  [employer]

## 2020-07-03 ENCOUNTER — Other Ambulatory Visit (INDEPENDENT_AMBULATORY_CARE_PROVIDER_SITE_OTHER): Payer: Medicaid Other

## 2020-07-03 DIAGNOSIS — R7989 Other specified abnormal findings of blood chemistry: Secondary | ICD-10-CM | POA: Diagnosis not present

## 2020-07-03 LAB — CBC WITH DIFFERENTIAL/PLATELET
Basophils Absolute: 0.1 10*3/uL (ref 0.0–0.1)
Basophils Relative: 0.8 % (ref 0.0–3.0)
Eosinophils Absolute: 0.3 10*3/uL (ref 0.0–0.7)
Eosinophils Relative: 3.5 % (ref 0.0–5.0)
HCT: 38.9 % — ABNORMAL LOW (ref 39.0–52.0)
Hemoglobin: 12.7 g/dL — ABNORMAL LOW (ref 13.0–17.0)
Lymphocytes Relative: 18.2 % (ref 12.0–46.0)
Lymphs Abs: 1.5 10*3/uL (ref 0.7–4.0)
MCHC: 32.6 g/dL (ref 30.0–36.0)
MCV: 77.2 fl — ABNORMAL LOW (ref 78.0–100.0)
Monocytes Absolute: 0.5 10*3/uL (ref 0.1–1.0)
Monocytes Relative: 6.2 % (ref 3.0–12.0)
Neutro Abs: 5.8 10*3/uL (ref 1.4–7.7)
Neutrophils Relative %: 71.3 % (ref 43.0–77.0)
Platelets: 317 10*3/uL (ref 150.0–400.0)
RBC: 5.05 Mil/uL (ref 4.22–5.81)
RDW: 17.8 % — ABNORMAL HIGH (ref 11.5–15.5)
WBC: 8.1 10*3/uL (ref 4.0–10.5)

## 2020-07-03 LAB — HEPATIC FUNCTION PANEL
ALT: 92 U/L — ABNORMAL HIGH (ref 0–53)
AST: 43 U/L — ABNORMAL HIGH (ref 0–37)
Albumin: 3.6 g/dL (ref 3.5–5.2)
Alkaline Phosphatase: 761 U/L — ABNORMAL HIGH (ref 39–117)
Bilirubin, Direct: 0.3 mg/dL (ref 0.0–0.3)
Total Bilirubin: 0.7 mg/dL (ref 0.2–1.2)
Total Protein: 7.8 g/dL (ref 6.0–8.3)

## 2020-07-03 LAB — PROTIME-INR
INR: 0.9 ratio (ref 0.8–1.0)
Prothrombin Time: 9.9 s (ref 9.6–13.1)

## 2020-07-04 ENCOUNTER — Other Ambulatory Visit: Payer: Self-pay

## 2020-07-04 ENCOUNTER — Other Ambulatory Visit: Payer: Self-pay | Admitting: Internal Medicine

## 2020-07-04 MED ORDER — COLESTIPOL HCL 1 G PO TABS: 1.0000 g | ORAL_TABLET | Freq: Two times a day (BID) | ORAL | 3 refills | Status: DC

## 2020-07-07 ENCOUNTER — Telehealth (HOSPITAL_COMMUNITY): Payer: Self-pay | Admitting: Emergency Medicine

## 2020-07-07 NOTE — Telephone Encounter (Signed)
Reaching out to patient to offer assistance regarding upcoming cardiac imaging study; pt verbalizes understanding of appt date/time, parking situation and where to check in and verified current allergies; name and call back number provided for further questions should they arise Vonya Ohalloran RN Navigator Cardiac Imaging [facility] [phone] office [phone] cell  Denies implants, denies claustro 

## 2020-07-10 ENCOUNTER — Other Ambulatory Visit: Payer: Self-pay

## 2020-07-10 ENCOUNTER — Other Ambulatory Visit: Payer: Self-pay | Admitting: Cardiology

## 2020-07-10 ENCOUNTER — Ambulatory Visit (HOSPITAL_COMMUNITY)
Admission: RE | Admit: 2020-07-10 | Discharge: 2020-07-10 | Disposition: A | Discharge status: Home / Self Care | Payer: Medicaid Other | Source: Ambulatory Visit | Attending: Cardiology | Admitting: Cardiology

## 2020-07-10 DIAGNOSIS — I5189 Other ill-defined heart diseases: Secondary | ICD-10-CM | POA: Diagnosis not present

## 2020-07-10 DIAGNOSIS — I209 Angina pectoris, unspecified: Secondary | ICD-10-CM | POA: Diagnosis not present

## 2020-07-10 IMAGING — MR MR CARD MORPHOLOGY WO/W CM
45 of 48 series · 45 of 48 positions shown · IV contrast (gadavist)
Comparison: none

CLINICAL DATA: Evaluate possible AV mass

EXAM:
CARDIAC MRI
TECHNIQUE: The patient was scanned on a 1.5 Tesla Siemens magnet. A dedicated
cardiac coil was used. Functional imaging was done using Fiesta
sequences. [DATE], and 4 chamber views were done to assess for RWMA's.
Modified ETUKO rule using a short axis stack was used to
calculate an ejection fraction on a dedicated work station using
Circle software. The patient received 10 cc of Gadavist. After 10
minutes inversion recovery sequences were used to assess for
infiltration and scar tissue.
CONTRAST:  10 cc  of Gadavist

[Series 4: [id number] · axial · 8.0mm · 1.41mm/px · 1 of 16 slices shown]
[im 1/16]
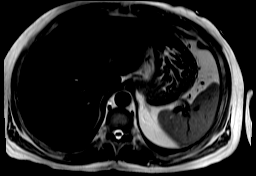

[Series 8: bSSFP · oblique · 8.0mm · 1.61mm/px · 1 of 25 slices shown (1 of 33)]
[im 1/25]
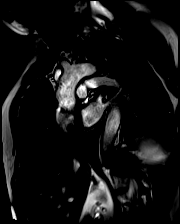

[Series 9: bSSFP · oblique · 8.0mm · 1.61mm/px · 1 of 25 slices shown (2 of 33)]
[im 1/25]
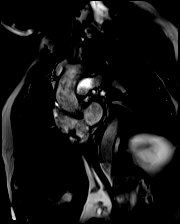

[Series 10: bSSFP · oblique · 8.0mm · 1.61mm/px · 1 of 25 slices shown (3 of 33)]
[im 1/25]
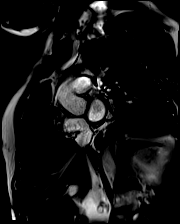

[Series 11: bSSFP · oblique · 8.0mm · 1.61mm/px · 1 of 25 slices shown (4 of 33)]
[im 1/25]
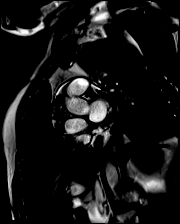

[Series 12: bSSFP · oblique · 8.0mm · 1.61mm/px · 1 of 25 slices shown (5 of 33)]
[im 1/25]
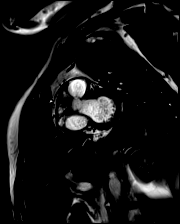

[Series 13: bSSFP · oblique · 8.0mm · 1.61mm/px · 1 of 25 slices shown (6 of 33)]
[im 1/25]
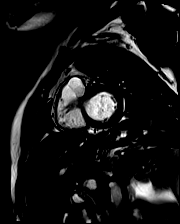

[Series 14: bSSFP · oblique · 8.0mm · 1.61mm/px · 1 of 25 slices shown (7 of 33)]
[im 1/25]
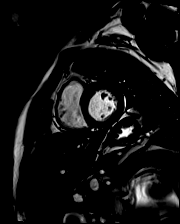

[Series 15: bSSFP · oblique · 8.0mm · 1.61mm/px · 1 of 25 slices shown (8 of 33)]
[im 1/25]
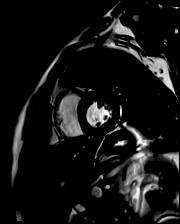

[Series 16: bSSFP · oblique · 8.0mm · 1.61mm/px · 1 of 25 slices shown (9 of 33)]
[im 1/25]
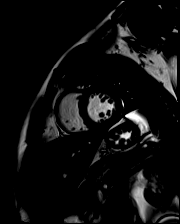

[Series 17: bSSFP · oblique · 8.0mm · 1.61mm/px · 1 of 25 slices shown (10 of 33)]
[im 1/25]
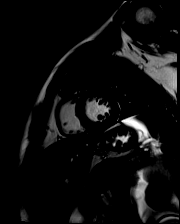

[Series 18: bSSFP · oblique · 8.0mm · 1.61mm/px · 1 of 25 slices shown (11 of 33)]
[im 1/25]
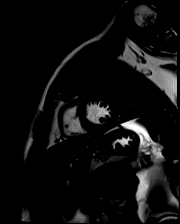

[Series 19: bSSFP · oblique · 8.0mm · 1.61mm/px · 1 of 25 slices shown (12 of 33)]
[im 1/25]
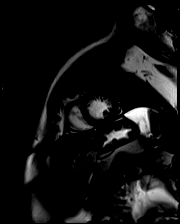

[Series 20: bSSFP · oblique · 8.0mm · 1.61mm/px · 1 of 25 slices shown (13 of 33)]
[im 1/25]
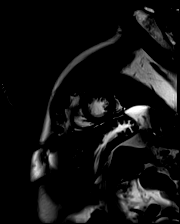

[Series 21: bSSFP · oblique · 8.0mm · 1.61mm/px · 1 of 25 slices shown (14 of 33)]
[im 1/25]
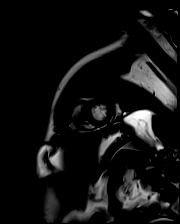

[Series 22: bSSFP · oblique · 8.0mm · 1.61mm/px · 1 of 25 slices shown (15 of 33)]
[im 1/25]
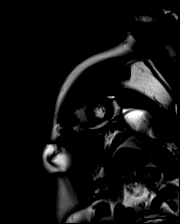

[Series 23: bSSFP · oblique · 8.0mm · 1.61mm/px · 1 of 25 slices shown (16 of 33)]
[im 1/25]
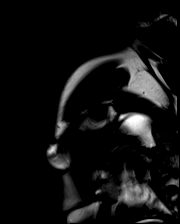

[Series 24: bSSFP · oblique · 8.0mm · 1.61mm/px · 1 of 25 slices shown (17 of 33)]
[im 1/25]
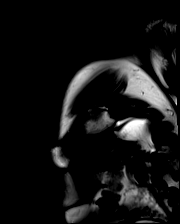

[Series 25: bSSFP · oblique · 8.0mm · 1.61mm/px · 1 of 25 slices shown (18 of 33)]
[im 1/25]
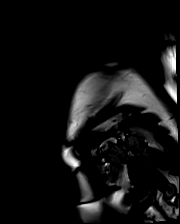

[Series 26: bSSFP · oblique · 8.0mm · 1.61mm/px · 1 of 25 slices shown (19 of 33)]
[im 1/25]
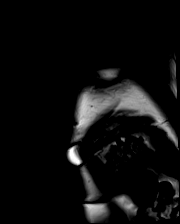

[Series 27: bSSFP · oblique · 6.0mm · 1.41mm/px · 1 of 25 slices shown (20 of 33)]
[im 1/25]
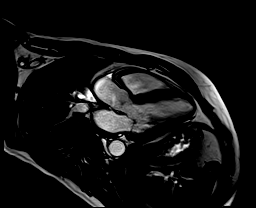

[Series 28: bSSFP · oblique · 6.0mm · 1.41mm/px · 1 of 25 slices shown (21 of 33)]
[im 1/25]
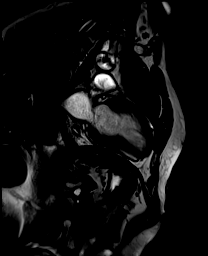

[Series 29: bSSFP · axial · 6.0mm · 1.41mm/px · 1 of 25 slices shown (22 of 33)]
[im 1/25]
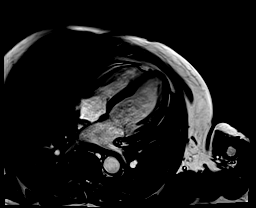

[Series 30: bSSFP · oblique · 5.0mm · 1.41mm/px · 1 of 25 slices shown (23 of 33)]
[im 1/25]
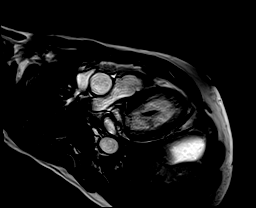

[Series 31: bSSFP · oblique · 5.0mm · 1.41mm/px · 1 of 25 slices shown (24 of 33)]
[im 1/25]
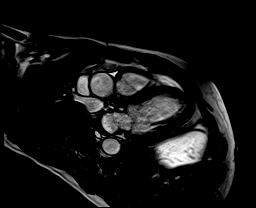

[Series 32: bSSFP · oblique · 5.0mm · 1.41mm/px · 1 of 25 slices shown (25 of 33)]
[im 1/25]
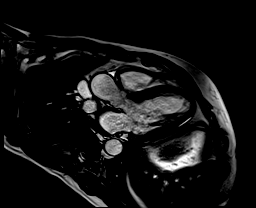

[Series 33: bSSFP · oblique · 5.0mm · 1.41mm/px · 1 of 25 slices shown (26 of 33)]
[im 1/25]
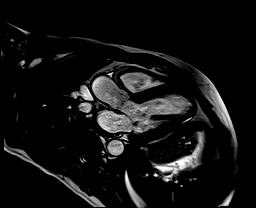

[Series 34: bSSFP · oblique · 5.0mm · 1.41mm/px · 1 of 25 slices shown (27 of 33)]
[im 1/25]
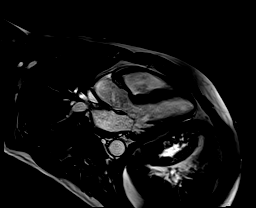

[Series 35: bSSFP · oblique · 5.0mm · 1.41mm/px · 1 of 25 slices shown (28 of 33)]
[im 1/25]
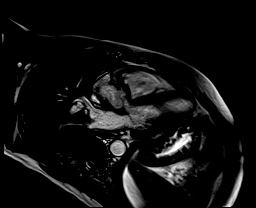

[Series 36: bSSFP · oblique · 5.0mm · 1.41mm/px · 1 of 25 slices shown (29 of 33)]
[im 1/25]
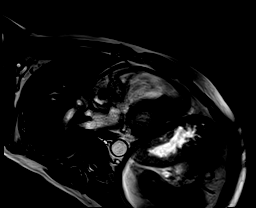

[Series 37: bSSFP · oblique · 5.0mm · 1.41mm/px · 1 of 25 slices shown (30 of 33)]
[im 1/25]
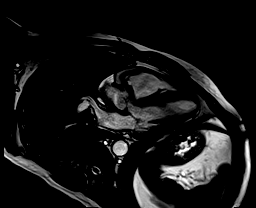

[Series 38: bSSFP · coronal · 6.0mm · 1.41mm/px · 1 of 25 slices shown (31 of 33)]
[im 1/25]
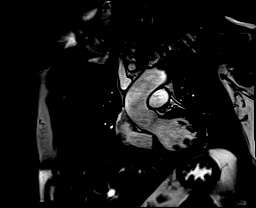

[Series 39: bSSFP · coronal · 6.0mm · 1.41mm/px · 1 of 25 slices shown (32 of 33)]
[im 1/25]
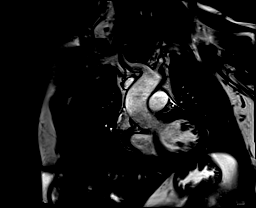

[Series 40: bSSFP · coronal · 6.0mm · 1.41mm/px · 1 of 25 slices shown (33 of 33)]
[im 1/25]
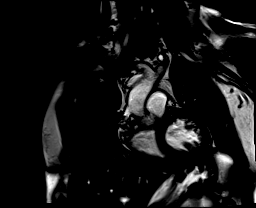

[Series 42: av thins · sagittal · 5.0mm · 1.61mm/px · 1 of 25 slices shown (1 of 8)]
[im 1/25]
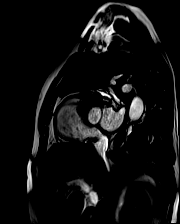

[Series 42: av thins · sagittal · 5.0mm · 1.61mm/px · 1 of 25 slices shown (2 of 8)]
[im 1/25]
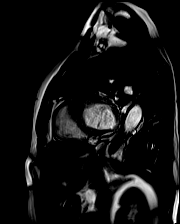

[Series 42: av thins · sagittal · 5.0mm · 1.61mm/px · 1 of 25 slices shown (3 of 8)]
[im 1/25]
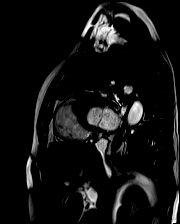

[Series 42: av thins · sagittal · 5.0mm · 1.61mm/px · 1 of 25 slices shown (4 of 8)]
[im 1/25]
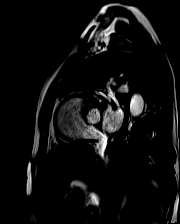

[Series 42: av thins · sagittal · 5.0mm · 1.61mm/px · 1 of 25 slices shown (5 of 8)]
[im 1/25]
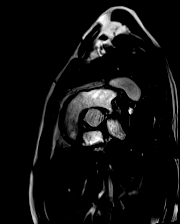

[Series 42: av thins · sagittal · 5.0mm · 1.61mm/px · 1 of 25 slices shown (6 of 8)]
[im 1/25]
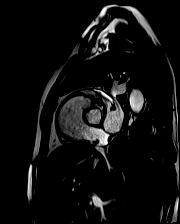

[Series 42: av thins · sagittal · 5.0mm · 1.61mm/px · 1 of 25 slices shown (7 of 8)]
[im 1/25]
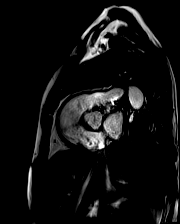

[Series 42: av thins · sagittal · 5.0mm · 1.61mm/px · 1 of 25 slices shown (8 of 8)]
[im 1/25]
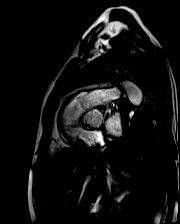

[Series 43: (id)_long_t1 · oblique · 8.0mm · 2.08mm/px · 1 of 24 slices shown]
[im 1/24]
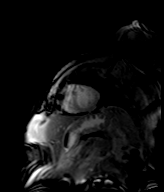

[Series 44: (id)_long_t1_moco · oblique · 8.0mm · 2.08mm/px · 1 of 24 slices shown]
[im 1/24]
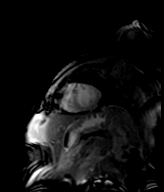

[Series 45: (id)_long_t1_moco_t1 · oblique · 8.0mm · 2.08mm/px · 1 of 5 slices shown]
[im 1/5]
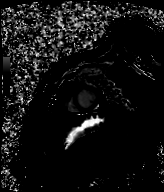

[45 of 48 positions shown; findings below may reference images not displayed]

FINDINGS: Left ventricle:

-Mild hypertrophy

-Normal size

-Normal systolic function

-Nonspecific elevation in ECV (30%)

-No LGE

LV EF: 58% (Normal 56-78%)

Absolute volumes:

LV EDV: 120mL (Normal 77-195 mL)

LV ESV: 50mL (Normal 19-72 mL)

LV SV: 70mL (Normal 51-133 mL)

CO: 6.5L/min (Normal 2.8-8.8 L/min)

Indexed volumes:

LV EDV: 66mL/sq-m (Normal 47-92 mL/sq-m)

LV ESV: 27mL/sq-m (Normal 13-30 mL/sq-m)

LV SV: 38mL/sq-m (Normal 32-62 mL/sq-m)

CI: 3.6L/min/sq-m (Normal 1.7-4.2 L/min/sq-m)

Right ventricle: Normal size and systolic function

RV EF:  64% (Normal 47-74%)

Absolute volumes:

RV EDV: 116mL (Normal 88-227 mL)

RV ESV: 42mL (Normal 23-103 mL)

RV SV: 74mL (Normal 52-138 mL)

CO: 6.9L/min (Normal 2.8-8.8 L/min)

Indexed volumes:

RV EDV: 64mL/sq-m (Normal 55-105 mL/sq-m)

RV ESV: 23mL/sq-m (Normal 15-43 mL/sq-m)

RV SV: 41mL/sq-m (Normal 32-64 mL/sq-m)

CI: 3.8L/min/sq-m (Normal 1.7-4.2 L/min/sq-m)

Left atrium: Mild enlargement

Right atrium: Normal size

Mitral valve: No regurgitation

Aortic valve: No regurgitation

Tricuspid valve: No regurgitation

Pulmonic valve: No regurgitation

Aorta: Normal proximal ascending aorta

Pericardium: Normal
IMPRESSION: 1. No cardiac mass seen, though MRI is of limited utility for
evaluating highly mobile structures such as valvular masses,
echocardiogram is preferred.

2. Normal LV size, mild hypertrophy, and normal systolic function
(EF 58%)

3.  Normal RV size and systolic function (EF 64%)

4.  No late gadolinium enhancement to suggest myocardial scar

## 2020-07-10 MED ORDER — GADOBUTROL 1 MMOL/ML IV SOLN
10.0000 mL | Freq: Once | INTRAVENOUS | Status: AC | PRN
  Administered 2020-07-10: 10 mL via INTRAVENOUS

## 2020-07-11 ENCOUNTER — Telehealth: Payer: Self-pay

## 2020-07-11 NOTE — Telephone Encounter (Signed)
Patient notified of test results. Results route to PCP.

## 2020-07-21 ENCOUNTER — Encounter: Payer: Self-pay | Admitting: *Deleted

## 2020-07-24 ENCOUNTER — Encounter: Payer: Self-pay | Admitting: Internal Medicine

## 2020-07-24 ENCOUNTER — Ambulatory Visit (INDEPENDENT_AMBULATORY_CARE_PROVIDER_SITE_OTHER): Payer: Medicaid Other | Admitting: Internal Medicine

## 2020-07-24 ENCOUNTER — Other Ambulatory Visit (INDEPENDENT_AMBULATORY_CARE_PROVIDER_SITE_OTHER): Payer: Medicaid Other

## 2020-07-24 VITALS — BP 130/80 | HR 106 | Ht 65.0 in | Wt 160.0 lb

## 2020-07-24 DIAGNOSIS — R7989 Other specified abnormal findings of blood chemistry: Secondary | ICD-10-CM

## 2020-07-24 DIAGNOSIS — L298 Other pruritus: Secondary | ICD-10-CM

## 2020-07-24 DIAGNOSIS — K831 Obstruction of bile duct: Secondary | ICD-10-CM | POA: Diagnosis not present

## 2020-07-24 LAB — CBC WITH DIFFERENTIAL/PLATELET
Basophils Absolute: 0.1 10*3/uL (ref 0.0–0.1)
Basophils Relative: 0.6 % (ref 0.0–3.0)
Eosinophils Absolute: 0.1 10*3/uL (ref 0.0–0.7)
Eosinophils Relative: 1.2 % (ref 0.0–5.0)
HCT: 39.6 % (ref 39.0–52.0)
Hemoglobin: 12.2 g/dL — ABNORMAL LOW (ref 13.0–17.0)
Lymphocytes Relative: 13.4 % (ref 12.0–46.0)
Lymphs Abs: 1.2 10*3/uL (ref 0.7–4.0)
MCHC: 30.8 g/dL (ref 30.0–36.0)
MCV: 81.4 fl (ref 78.0–100.0)
Monocytes Absolute: 0.5 10*3/uL (ref 0.1–1.0)
Monocytes Relative: 5.2 % (ref 3.0–12.0)
Neutro Abs: 7.2 10*3/uL (ref 1.4–7.7)
Neutrophils Relative %: 79.6 % — ABNORMAL HIGH (ref 43.0–77.0)
Platelets: 297 10*3/uL (ref 150.0–400.0)
RBC: 4.86 Mil/uL (ref 4.22–5.81)
RDW: 18.1 % — ABNORMAL HIGH (ref 11.5–15.5)
WBC: 9.1 10*3/uL (ref 4.0–10.5)

## 2020-07-24 LAB — HEPATIC FUNCTION PANEL
ALT: 91 U/L — ABNORMAL HIGH (ref 0–53)
AST: 98 U/L — ABNORMAL HIGH (ref 0–37)
Albumin: 3 g/dL — ABNORMAL LOW (ref 3.5–5.2)
Alkaline Phosphatase: 946 U/L — ABNORMAL HIGH (ref 39–117)
Bilirubin, Direct: 0.9 mg/dL — ABNORMAL HIGH (ref 0.0–0.3)
Total Bilirubin: 1.6 mg/dL — ABNORMAL HIGH (ref 0.2–1.2)
Total Protein: 6.7 g/dL (ref 6.0–8.3)

## 2020-07-24 LAB — PROTIME-INR
INR: 1 ratio (ref 0.8–1.0)
Prothrombin Time: 11.3 s (ref 9.6–13.1)

## 2020-07-24 MED ORDER — COLESTIPOL HCL 1 G PO TABS: 2.0000 g | ORAL_TABLET | Freq: Two times a day (BID) | ORAL | 3 refills | Status: AC

## 2020-07-24 MED ORDER — HYDROXYZINE HCL 25 MG PO TABS: 25.0000 mg | ORAL_TABLET | Freq: Three times a day (TID) | ORAL | 1 refills | Status: DC | PRN

## 2020-07-24 NOTE — Patient Instructions (Addendum)
We have sent the following medications to your pharmacy for you to pick up at your convenience: Colestipol 2 grams twice daily (increase from previous dosage) Hydroxyzine 25 mg three times daily for itching  Your provider has requested that you go to the basement level for lab work before leaving today. Press "B" on the elevator. The lab is located at the first door on the left as you exit the elevator.  If you are age 53 or younger, your body mass index should be between 19-25. Your Body mass index is 26.63 kg/m. If this is out of the aformentioned range listed, please consider follow up with your Primary Care Provider.   Due to recent changes in healthcare laws, you may see the results of your imaging and laboratory studies on MyChart before your provider has had a chance to review them.  We understand that in some cases there may be results that are confusing or concerning to you. Not all laboratory results come back in the same time frame and the provider may be waiting for multiple results in order to interpret others.  Please give [place] 48 hours in order for your provider to thoroughly review all the results before contacting the office for clarification of your results.  ___________________________________________________________________  [place] siguientes medicamentos a su farmacia para que los recoja a su conveniencia: Colestipol 2 gramos dos veces al da (aumento de la dosis anterior) Hidroxizina 25 mg tres veces al da para la picazn  Su proveedor le ha pedido que vaya al nivel del stano para el trabajo de laboratorio antes de salir hoy. Presione "B" en el ascensor. El laboratorio est ubicado en la primera puerta a la izquierda al salir del Medical sales representative.  Si tiene 53 aos o menos, su ndice de [employer] corporal debe estar entre 19 y 10. Su ndice de masa corporal es de 26,63 kg/m. Si esto est fuera del rango mencionado anteriormente, considere hacer un seguimiento con su proveedor  de Marine scientist.  Debido a cambios recientes en las [address] de atencin mdica, es posible que vea los [place] de sus estudios de imgenes y de laboratorio en MyChart antes de que su proveedor haya tenido la oportunidad de revisarlos. Entendemos que en algunos casos puede haber resultados confusos o preocupantes para usted. No todos los resultados de laboratorio regresan en el mismo perodo de tiempo y el proveedor puede estar esperando mltiples resultados para Administrator, Civil Service. Denos 48 horas para que su proveedor revise minuciosamente todos los resultados antes de comunicarse con la oficina para Engineer, structural.

## 2020-07-25 ENCOUNTER — Encounter: Payer: Self-pay | Admitting: Internal Medicine

## 2020-07-25 ENCOUNTER — Telehealth: Payer: Self-pay | Admitting: *Deleted

## 2020-07-25 DIAGNOSIS — K831 Obstruction of bile duct: Secondary | ICD-10-CM

## 2020-07-25 DIAGNOSIS — R7989 Other specified abnormal findings of blood chemistry: Secondary | ICD-10-CM

## 2020-07-25 DIAGNOSIS — L298 Other pruritus: Secondary | ICD-10-CM

## 2020-07-25 NOTE — Progress Notes (Signed)
Subjective:    Patient ID: Joshua Nash, male    DOB: 02-03-1966, 55 y.o.   MRN: [id number]  HPI Leonte Horrigan is a 55 year old male with a history of recent hospitalization found to have elevated liver enzymes in a cholestatic pattern and severe pruritus, diabetes, hypertension, CKD who is seen for hospital follow-up.  He is here today with his wife and a medical Spanish interpreter.  He was hospitalized in April with pruritus, generalized fatigue and upper abdominal and chest pain.  His liver enzymes were markedly abnormal with alkaline phosphatase over 700, very modest elevation in AST and ALT.  GGT was abnormal at 1829.  Bilirubin at that time had remained normal.  There was concern for PBC however his antimitochondrial antibody was negative.  Anti-smooth muscle negative.  Viral hepatitis serologies negative.  He underwent a liver biopsy while hospitalized and was subsequently discharged.  The liver biopsy returned most consistent with a subacute drug-induced liver injury.  He reports that he is miserable with extreme pruritus.  This is all the time and he is unable to work and barely able to remain calm due to the itching.  He had been taking hydroxyzine which helped only a little bit.  I prescribed colestipol 1 g twice daily which he took for a few days because he became so concerned about the pruritus he stopped all of his medications.  He has been off all medications for about a week.  It is hard for him to sleep due to his itching.  He denies abdominal pain.  Denies abdominal and lower extremity swelling.  Cross-sectional imaging with MRI/MRCP in the hospital showed cholelithiasis without cholecystitis or biliary ductal dilation.  There was hepatomegaly.  There were gallstones with gallbladder adenomyomatosis by ultrasound.  CT scan showed dense calcifications within the gallbladder but no ductal dilation.  Review of Systems As per HPI, otherwise negative  Current  Medications, Allergies, Past Medical History, Past Surgical History, Family History and Social History were reviewed in [employer] record.     Objective:   Physical Exam BP 130/80   Pulse (!) 106   Ht 5\' 5"  (1.651 m)   Wt 160 lb (72.6 kg)   SpO2 98%   BMI 26.63 kg/m   Gen: awake, alert, very uncomfortable male constantly scratching himself during the entire encounter HEENT: Mildly icteric sclera, op clear CV: Tachycardic but regular Pulm: CTA b/l Abd: soft, NT/ND, +BS throughout Ext: no c/c/e Skin: Excoriations on his scalp, upper and lower extremities, abdomen, chest and back Neuro: nonfocal       Assessment & Plan:  55 year old male with a history of recent hospitalization found to have elevated liver enzymes in a cholestatic pattern and severe pruritus, diabetes, hypertension, CKD who is seen for hospital follow-up.  1.  Cholestasis/drug-induced liver injury --biopsy did not show features of PBC and was most consistent with subacute drug-induced liver injury.  The only chronic medication that preceded this injury that the liver database suggested was a possible culprit was amlodipine.  He has now been off of this medication for about 1 week.  He is miserable with pruritus and I have recommended the following: -- Resume colestipol but increase to 2 g twice daily; try this for 5 days and if not improving we will try rifampin for pruritus or opioid antagonist -- Repeat CBC, hepatic function panel and INR today -- Resume hydroxyzine 25 mg 3 times daily -- We will need to monitor liver enzymes closely going  forward -- I encouraged him to contact primary care as he is now off of cholesterol and diabetes medications.  He will need this therapy added back but attention needs to be given 2 drugs that do not have risk of liver toxicity. -- Follow-up in 4 to 8 weeks; though we will be in touch when I see lab results  2. CRC screening --screening colonoscopy  recommended once his liver injury and pruritus improved  Work note provided taking him out of work at least through the end of this month due to intense pruritus  30 minutes total spent today including patient facing time, coordination of care, reviewing medical history/procedures/pertinent radiology studies, and documentation of the encounter.

## 2020-07-25 NOTE — Telephone Encounter (Signed)
Spoke with patient and advised of recommendations. Patient has been provided with the address to [facility] and advised of instructions for MRI appointment. Patient will come in tomorrow for additional lab work after his MRI. Patient is aware that he has been referred to [facility] liver care and someone will be in contact with him soon in regards to that appointment. Answered all of patient's questions, he verbalized understanding and had no concerns at the end of the call.

## 2020-07-25 NOTE — Telephone Encounter (Signed)
-----   Message from Beverley Fiedler, MD sent at 07/24/2020  4:58 PM EDT ----- Pt is having severe itching and has cholestasis which is not improving.   Liver bx most consistent with DILI Now liver enzymes are more elevated than before  Please see if the following labs can be added and if not then he should return for additional lab draw He also has known gallstones and I want to be sure no CBD obstruction at this point (MRCP was neg last month) --MRI abd + MCRP -- order ASAP --AMA, ASMA, ACE level --Referral to [facility] clinic for elevated liver enzymes and severe pruritis, ask to be seen ASAP

## 2020-07-25 NOTE — Telephone Encounter (Signed)
Patient has been referred to [facility] (phone [phone] fax [phone]). Per [facility], they have received the referral and are currently reviewing records.

## 2020-07-25 NOTE — Telephone Encounter (Signed)
Per lab, patient will need to return for labs as the appropriate tubes would need to be drawn for AMA, ASMA, and ACE level. Orders have been placed in Epic for this. I have also placed orders in Epic for STAT MR abd/MRCP. Patient has been scheduled for MRI/MRCP at [facility] on Wednesday, 07/26/20 at 12:00 pm, 11:45 pm arrival. He should be NPO 4 hours prior. In addition, he will be referred to the Liver Clinic ASAP for evaluation. He will be contacted about this as soon as an appointment has been made for him.

## 2020-07-26 ENCOUNTER — Other Ambulatory Visit: Payer: Self-pay | Admitting: Internal Medicine

## 2020-07-26 ENCOUNTER — Ambulatory Visit (HOSPITAL_COMMUNITY)
Admission: RE | Admit: 2020-07-26 | Discharge: 2020-07-26 | Disposition: A | Discharge status: Home / Self Care | Payer: Medicaid Other | Source: Ambulatory Visit | Attending: Internal Medicine | Admitting: Internal Medicine

## 2020-07-26 ENCOUNTER — Other Ambulatory Visit: Payer: Self-pay

## 2020-07-26 DIAGNOSIS — R7989 Other specified abnormal findings of blood chemistry: Secondary | ICD-10-CM

## 2020-07-26 DIAGNOSIS — K831 Obstruction of bile duct: Secondary | ICD-10-CM | POA: Insufficient documentation

## 2020-07-26 DIAGNOSIS — L298 Other pruritus: Secondary | ICD-10-CM

## 2020-07-26 IMAGING — MR MR ABDOMEN WO/W CM MRCP
19 of 21 series · 43 of 48 positions shown · IV contrast (gadavist)
Comparison: [DATE]

CLINICAL DATA: Elevated liver function studies and pruritis.

EXAM:
MRI ABDOMEN WITHOUT AND WITH CONTRAST (INCLUDING MRCP)
TECHNIQUE: Multiplanar multisequence MR imaging of the abdomen was performed
both before and after the administration of intravenous contrast.
Heavily T2-weighted images of the biliary and pancreatic ducts were
obtained, and three-dimensional MRCP images were rendered by post
processing.
CONTRAST:  7mL GADAVIST GADOBUTROL 1 MMOL/ML IV SOLN

[Series 3: T2 fat-sat · axial · 6.0mm · 1.25mm/px · 1 of 36 slices shown]
[im 1/36]
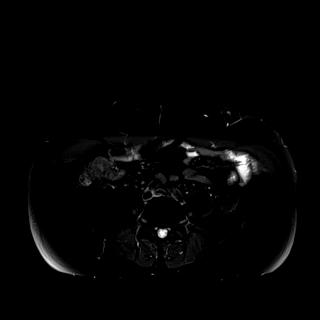

[Series 5: DWI · axial · 6.0mm · 1.49mm/px · z∈[-130,+150]mm · 2 of 80 slices shown (1 of 2)]
[im 1/80]
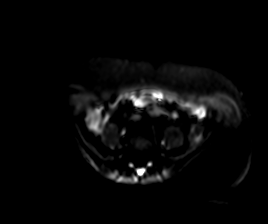
[im 80/80]
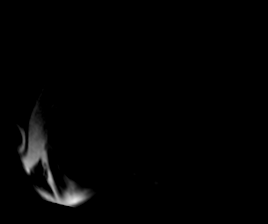

[Series 6: DWI · axial · 6.0mm · 1.49mm/px · 1 of 40 slices shown (2 of 2)]
[im 1/40]
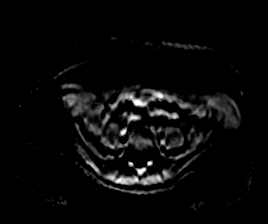

[Series 7: cor_3d_spc_trig-resp · 1 of 6 slices shown]
[im 1/6]
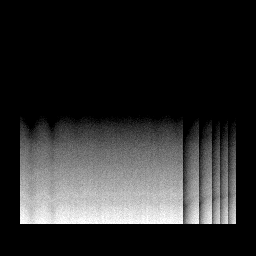

[Series 8: cor_3d_spc_trig · coronal · 1.0mm · 0.49mm/px · 3 of 88 slices shown]
[im 1/88]
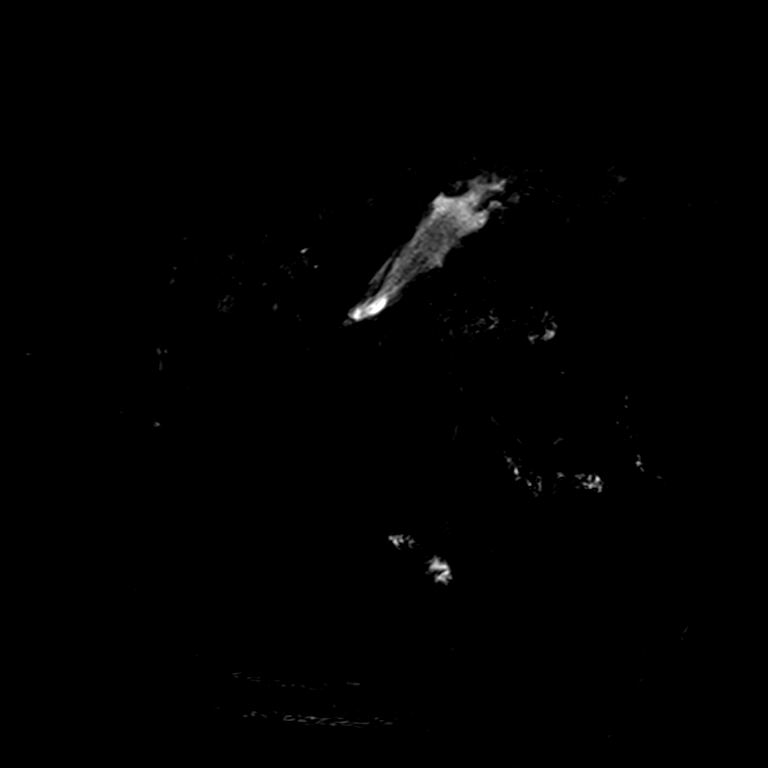
[im 44/88]
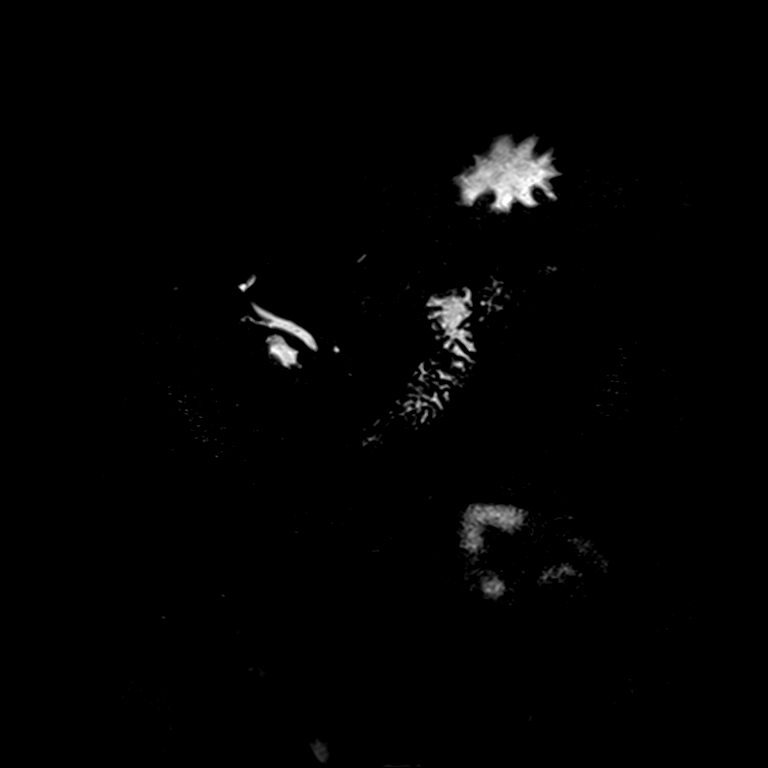
[im 88/88]
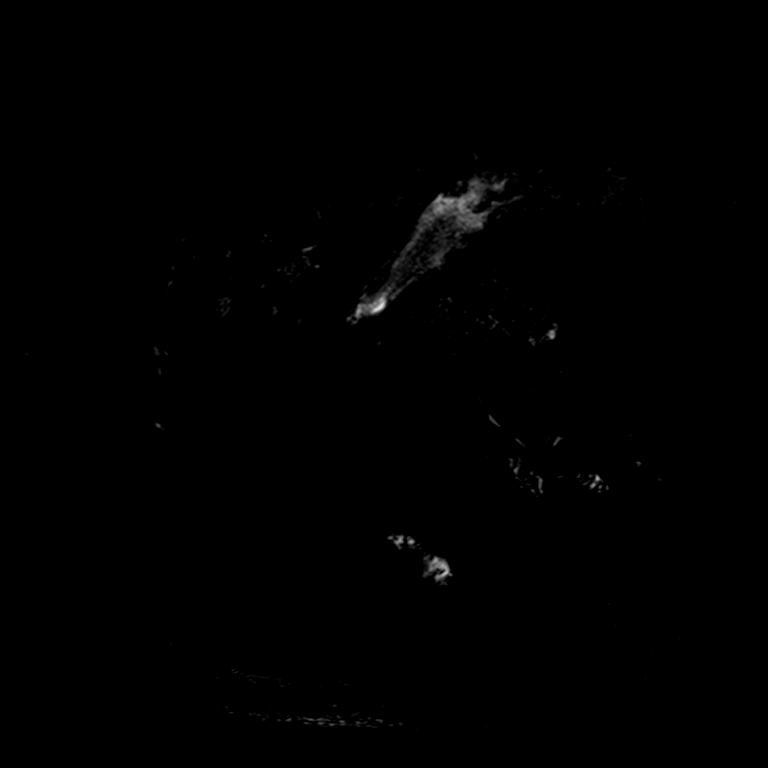

[Series 11: T2 · coronal · 6.0mm · 1.48mm/px · 1 of 33 slices shown (1 of 2)]
[im 1/33]
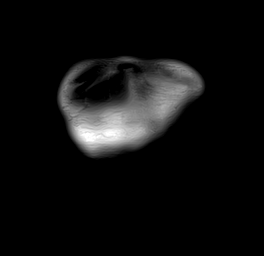

[Series 12: T1 · axial · 3.0mm · 1.25mm/px · z∈[-113,+147]mm · 3 of 88 slices shown (1 of 2)]
[im 1/88]
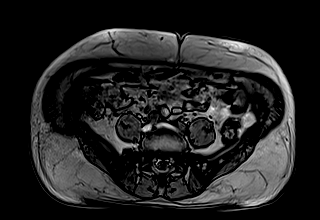
[im 44/88]
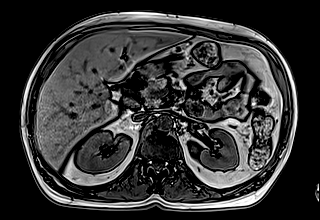
[im 88/88]
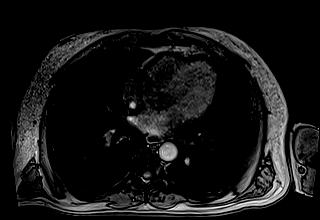

[Series 13: T1 · axial · 3.0mm · 1.25mm/px · z∈[-113,+147]mm · 3 of 88 slices shown (2 of 2)]
[im 1/88]
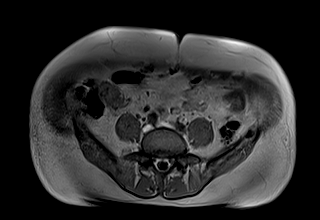
[im 44/88]
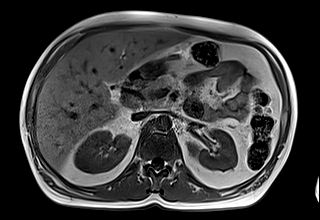
[im 88/88]
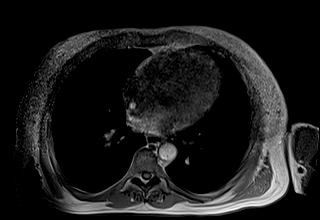

[Series 14: cor obl thk · sagittal · 50.0mm · 0.78mm/px · 1 of 8 slices shown]
[im 1/8]
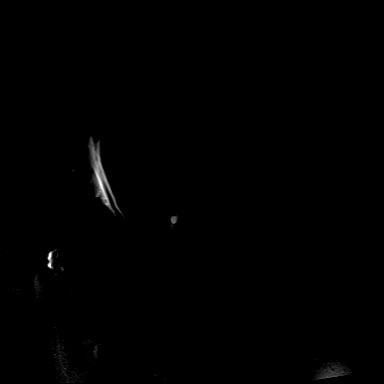

[Series 16: T2 · axial · 6.0mm · 1.56mm/px · 1 of 38 slices shown (2 of 2)]
[im 1/38]
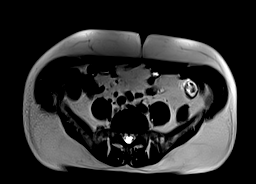

[Series 18: T1 dynamic · axial · 3.0mm · 1.25mm/px · z∈[-112,+149]mm · 3 of 88 slices shown (1 of 9)]
[im 1/88]
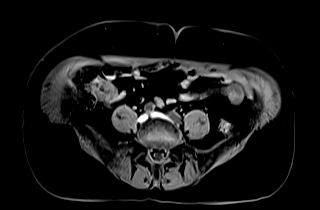
[im 44/88]
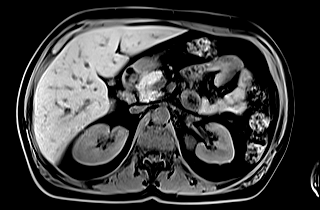
[im 88/88]
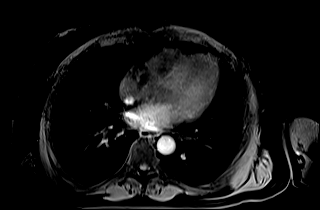

[Series 22: T1 dynamic · axial · 3.0mm · 1.25mm/px · z∈[-112,+149]mm · 3 of 88 slices shown (2 of 9)]
[im 1/88]
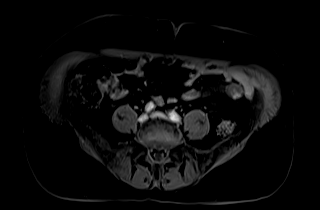
[im 44/88]
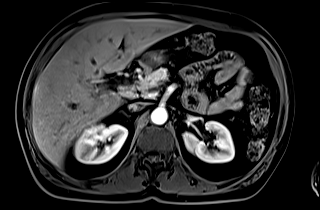
[im 88/88]
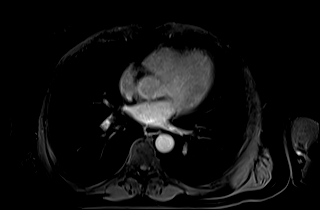

[Series 23: T1 dynamic · axial · 3.0mm · 1.25mm/px · z∈[-112,+149]mm · 3 of 88 slices shown (3 of 9)]
[im 1/88]
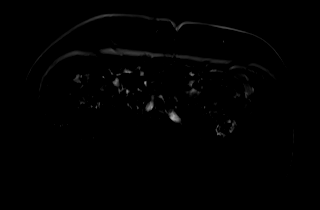
[im 44/88]
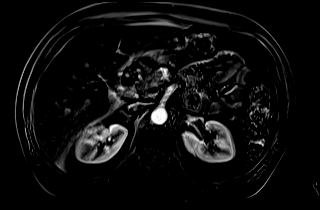
[im 88/88]
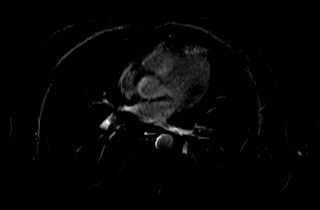

[Series 26: T1 dynamic · axial · 3.0mm · 1.25mm/px · z∈[-112,+149]mm · 3 of 88 slices shown (4 of 9)]
[im 1/88]
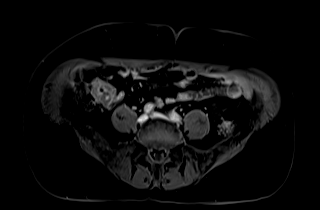
[im 44/88]
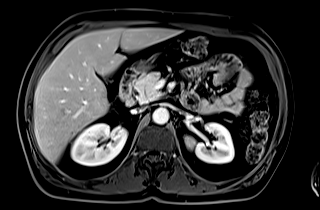
[im 88/88]
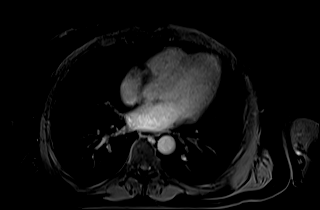

[Series 27: T1 dynamic · axial · 3.0mm · 1.25mm/px · z∈[-112,+149]mm · 3 of 88 slices shown (5 of 9)]
[im 1/88]
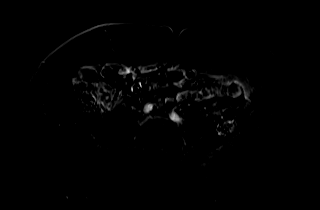
[im 44/88]
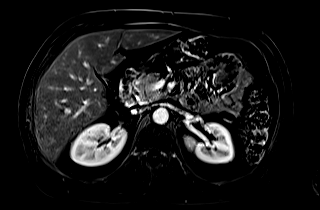
[im 88/88]
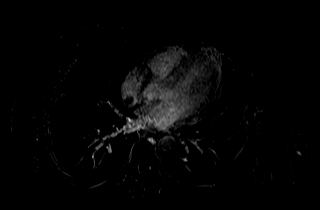

[Series 30: T1 dynamic · axial · 3.0mm · 1.25mm/px · z∈[-112,+149]mm · 3 of 88 slices shown (6 of 9)]
[im 1/88]
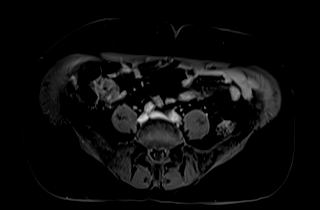
[im 44/88]
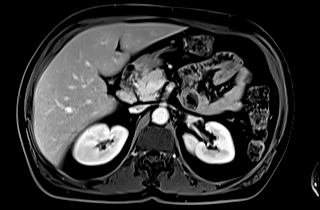
[im 88/88]
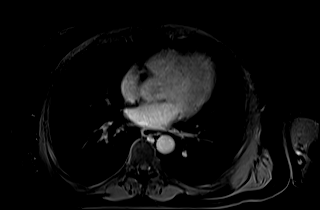

[Series 31: T1 dynamic · axial · 3.0mm · 1.25mm/px · z∈[-112,+149]mm · 3 of 88 slices shown (7 of 9)]
[im 1/88]
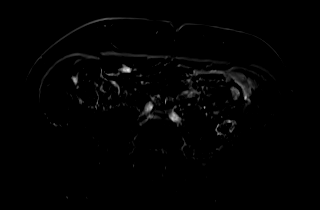
[im 44/88]
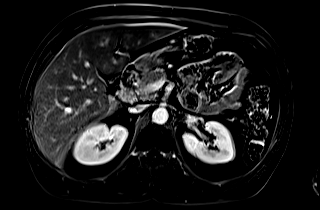
[im 88/88]
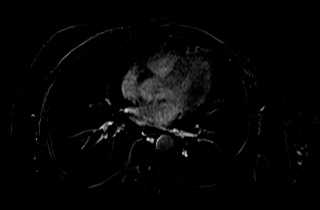

[Series 33: T1 dynamic · coronal · 4.0mm · 1.41mm/px · 2 of 56 slices shown (8 of 9)]
[im 1/56]
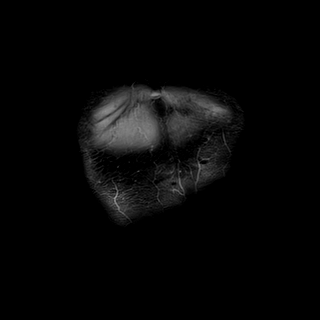
[im 56/56]
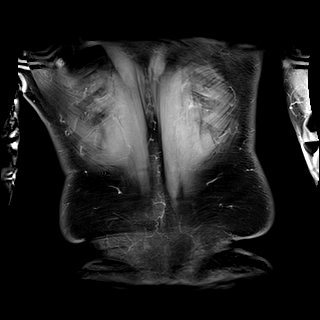

[Series 36: T1 dynamic · axial · 3.0mm · 1.25mm/px · z∈[-112,+149]mm · 3 of 88 slices shown (9 of 9)]
[im 1/88]
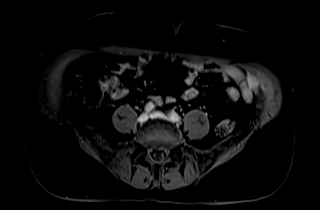
[im 44/88]
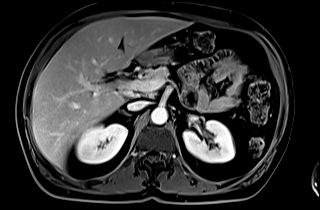
[im 88/88]
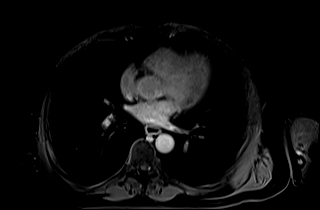

[43 of 48 positions shown; findings below may reference images not displayed]

FINDINGS: Lower chest: The lung bases are clear of an acute process. No
pulmonary lesions or pleural effusions. No pericardial effusion.

Hepatobiliary: No hepatic lesions or intrahepatic biliary
dilatation. No evidence of fatty infiltration. The gallbladder is
difficult to see on the T2 weighted images. It appears to be
contracted and filled with small gallstones. No pericholecystic
fluid. Normal caliber and course of the common bile duct. No common
bile duct stones.

Pancreas:  No mass, inflammation ductal dilatation.

Spleen:  Normal size.  No focal lesions.

Adrenals/Urinary Tract: The adrenal glands and kidneys unremarkable
and stable.

Stomach/Bowel: The stomach, duodenum, visualized small bowel and
visualized colon are unremarkable.

Vascular/Lymphatic: The aorta is normal in caliber. The branch
vessels are patent. The major venous structures are patent. No
mesenteric or retroperitoneal mass or adenopathy.

Other:  No ascites or abdominal wall hernia.

Musculoskeletal: No significant bony findings.
IMPRESSION: 1. The gallbladder is difficult to see on the T2 weighted images. It
appears to be contracted and filled with small gallstones. No
pericholecystic fluid.
2. Normal caliber and course of the common bile duct. No common bile
duct stones.
3. No hepatic lesions, obvious hepatic inflammatory process or fatty
infiltration.
4. No acute abdominal findings, mass lesions or adenopathy.

## 2020-07-26 MED ORDER — GADOBUTROL 1 MMOL/ML IV SOLN
7.0000 mL | Freq: Once | INTRAVENOUS | Status: AC | PRN
  Administered 2020-07-26: 7 mL via INTRAVENOUS

## 2020-07-28 NOTE — Telephone Encounter (Signed)
Spoke with patient, advised that he still needs to complete the additional lab work. Advised patient to have that done as soon as he can. He is aware that no appointment is necessary. He verbalized understanding and had no concerns at the end of the call.

## 2020-07-28 NOTE — Telephone Encounter (Signed)
We have received notice that [facility] has scheduled patient for appointment 08/03/20 at 1 pm and that a copy of consult note will be sent to our office as soon as it becomes available.

## 2020-07-31 ENCOUNTER — Other Ambulatory Visit: Payer: Medicaid Other

## 2020-07-31 DIAGNOSIS — R7989 Other specified abnormal findings of blood chemistry: Secondary | ICD-10-CM

## 2020-07-31 DIAGNOSIS — K831 Obstruction of bile duct: Secondary | ICD-10-CM

## 2020-07-31 DIAGNOSIS — L298 Other pruritus: Secondary | ICD-10-CM

## 2020-08-02 LAB — ANGIOTENSIN CONVERTING ENZYME: Angiotensin-Converting Enzyme: 48 U/L (ref 9–67)

## 2020-08-02 LAB — ANTI-SMOOTH MUSCLE ANTIBODY, IGG: Actin (Smooth Muscle) Antibody (IGG): <20 U (ref <20)

## 2020-08-02 LAB — MITOCHONDRIAL ANTIBODIES: Mitochondrial M2 Ab, IgG: <=20 U

## 2020-08-03 DIAGNOSIS — L299 Pruritus, unspecified: Secondary | ICD-10-CM

## 2020-08-03 DIAGNOSIS — G9332 Myalgic encephalomyelitis/chronic fatigue syndrome: Secondary | ICD-10-CM | POA: Insufficient documentation

## 2020-08-03 DIAGNOSIS — R809 Proteinuria, unspecified: Secondary | ICD-10-CM | POA: Insufficient documentation

## 2020-08-03 DIAGNOSIS — R748 Abnormal levels of other serum enzymes: Secondary | ICD-10-CM

## 2020-08-03 DIAGNOSIS — K8020 Calculus of gallbladder without cholecystitis without obstruction: Secondary | ICD-10-CM | POA: Insufficient documentation

## 2020-08-03 DIAGNOSIS — I152 Hypertension secondary to endocrine disorders: Secondary | ICD-10-CM

## 2020-08-03 HISTORY — DX: Pruritus, unspecified: L29.9

## 2020-08-03 HISTORY — DX: Hypertension secondary to endocrine disorders: I15.2

## 2020-08-03 HISTORY — DX: Calculus of gallbladder without cholecystitis without obstruction: K80.20

## 2020-08-03 HISTORY — DX: Abnormal levels of other serum enzymes: R74.8

## 2020-08-10 ENCOUNTER — Telehealth: Payer: Self-pay | Admitting: Internal Medicine

## 2020-08-10 NOTE — Telephone Encounter (Signed)
I agree that EUS/ERCP is a reasonable next step.  Appreciate the expert opinion here (Gabe) as well as [facility] team.  My technical approach to the ERCP (if EUS does not show an obvious mass in the area) would be to perform a good sphincterotomy,  brush (twice), +/- Spy. I may try not to leave a stent unless it's clearly needed.  First available with GM or DJ for EUS and ERCP sounds good.

## 2020-08-10 NOTE — Telephone Encounter (Signed)
I referred patient to see [facility] for second opinion regarding his ongoing cholestasis, profound elevation in alkaline phosphatase and diagnosis of drug-induced liver injury.  He was seen by Annamarie Major and his imaging was reviewed at multidisciplinary conference at [facility].  They are also planning to review his liver biopsy which was performed while he was hospitalized several months ago.  This biopsy showed drug-induced liver injury and not autoimmune liver disease/PBC.  He has continued to have profound cholestasis and pruritus and elevated alkaline phosphatase.  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  It was also the opinion of hepatobiliary surgery that this could be flow-limiting and causing his abnormalities without upstream dilation. We also know that he has had sludge and gallstones in his gallbladder which remains in place They also feel that there is likely a resolving drug-induced liver injury but the alkaline phosphatase elevation has a another explanation  They are recommending ERCP She reported that his transaminases have normalized but his alkaline phosphatase remains markedly abnormal and greater than 900  At this point I am going to asked Dr. Meridee Score and Dr. Christella Hartigan to review this case including the MRCP imaging and consider ERCP (possibly with EUS). Clearly he continues to have significant cholestasis and pruritus though he has improved somewhat with the higher dose colestipol and hydroxyzine  Dawn will also get back in touch with me after they review his liver biopsy slides in [place].

## 2020-08-10 NOTE — Telephone Encounter (Signed)
Joshua Nash, please contact the patient and let him know that we are recommending this internal procedure to help open up the bile duct.  This was also recommended by the [facility] group after they reviewed his recent imaging It is possible that his bile duct is not draining and this is causing his itching and trouble with bile draining from the liver  This can be scheduled with Dr. Christella Hartigan or Mansouraty, hopefully soon as he is quite symptomatic from cholestasis  I am very appreciative of the help

## 2020-08-10 NOTE — Telephone Encounter (Signed)
JMP, Thanks for reaching out. I do see on the last MRI/MRCP that there does look to be a slight tapering of the distal biliary tree as it makes its way to the ampulla. I think it is worth an EUS/ERCP to evaluate that area a bit more closely.  It does not look to have atypical nature of a malignant stricture and without overt biliary dilatation, malignancy would be much less likely. As long as we get in then and I sphincterotomy and leaving a stent in place will allow [place] to see if his alkaline phosphatase improves. If DJ agrees, then we can work on getting him scheduled next available with either of [place] since he has been stable. If that is the case then we can forward to Patty to start working on finding a slot for either of [place]. Thanks. GM

## 2020-08-11 NOTE — Telephone Encounter (Signed)
Left message on machine to call back  

## 2020-08-14 ENCOUNTER — Other Ambulatory Visit: Payer: Self-pay

## 2020-08-14 DIAGNOSIS — L298 Other pruritus: Secondary | ICD-10-CM

## 2020-08-14 DIAGNOSIS — R7989 Other specified abnormal findings of blood chemistry: Secondary | ICD-10-CM

## 2020-08-14 DIAGNOSIS — K831 Obstruction of bile duct: Secondary | ICD-10-CM

## 2020-08-14 NOTE — Telephone Encounter (Signed)
The pt has been scheduled for 7/14 at [facility] at 730 am with Dr Christella Hartigan for EUS ERCP.  Left message on machine to call back using interpreter Instructions have been mailed to the home.

## 2020-08-15 NOTE — Telephone Encounter (Signed)
Left message on machine to call back on home number  Also using interpreter left message with wife to return call

## 2020-08-16 NOTE — Telephone Encounter (Signed)
Left message on machine to call back using interpreter  

## 2020-08-17 NOTE — Telephone Encounter (Signed)
Unable to reach pt by phone.  Several messages left with family and voice mail.  All information has been mailed to the pt

## 2020-08-22 ENCOUNTER — Other Ambulatory Visit: Payer: Self-pay | Admitting: Internal Medicine

## 2020-08-22 DIAGNOSIS — I10 Essential (primary) hypertension: Secondary | ICD-10-CM | POA: Insufficient documentation

## 2020-08-22 DIAGNOSIS — E785 Hyperlipidemia, unspecified: Secondary | ICD-10-CM | POA: Insufficient documentation

## 2020-08-22 DIAGNOSIS — K8020 Calculus of gallbladder without cholecystitis without obstruction: Secondary | ICD-10-CM | POA: Insufficient documentation

## 2020-08-25 ENCOUNTER — Encounter: Payer: Self-pay | Admitting: Cardiology

## 2020-08-25 ENCOUNTER — Other Ambulatory Visit: Payer: Self-pay

## 2020-08-25 ENCOUNTER — Ambulatory Visit (INDEPENDENT_AMBULATORY_CARE_PROVIDER_SITE_OTHER): Payer: Medicaid Other | Admitting: Cardiology

## 2020-08-25 VITALS — BP 146/76 | HR 88 | Ht 65.0 in | Wt 158.2 lb

## 2020-08-25 DIAGNOSIS — R079 Chest pain, unspecified: Secondary | ICD-10-CM | POA: Diagnosis not present

## 2020-08-25 NOTE — Patient Instructions (Signed)

## 2020-08-25 NOTE — Progress Notes (Signed)
Cardiology Office Note:    Date:  08/25/2020   ID:  Joshua Nash, DOB 1965/07/25, MRN [id number]  PCP:  Charlott Rakes, MD  Cardiologist:  Garwin Brothers, MD   Referring MD: Charlott Rakes, MD    ASSESSMENT:    No diagnosis found. PLAN:    In order of problems listed above:  Primary prevention stressed with the patient.  Importance of compliance with diet medication stressed any vocalized understanding. Angina pectoris: His symptoms have not recurred.  Nitroglycerin protocol was again explained to him.  He has multiple issues especially hepatorenal which are very complex and complicated and is being followed by specialist for this. Elevated LFTs: As mentioned above managed by specialist. Patient had multiple questions which were answered by the interpreter help. Patient will be seen in follow-up appointment in 6 months or earlier if the patient has any concerns    Medication Adjustments/Labs and Tests Ordered: Current medicines are reviewed at length with the patient today.  Concerns regarding medicines are outlined above.  No orders of the defined types were placed in this encounter.  No orders of the defined types were placed in this encounter.    No chief complaint on file.    History of Present Illness:    Joshua Nash is a 55 y.o. male.  Patient has history of chest pain and was evaluated last time and sent to the emergency room.  He has markedly elevated LFTs and renal insufficiency.  He denies at this point any problems from a cardiovascular standpoint.  He is a diabetic.  At the time of my evaluation, the patient is alert awake oriented and in no distress.  No chest pain orthopnea or PND.  He was evaluated with the help of an interpreter.  Past Medical History:  Diagnosis Date   Abnormal electrocardiogram (ECG) (EKG)    Acute on chronic renal failure (HCC) 06/19/2020   AKI (acute kidney injury) (HCC) 06/21/2020   Angina pectoris (HCC)  06/01/2020   Calculus of gallbladder without cholecystitis without obstruction 08/03/2020   Cardiac mass 06/01/2020   Chest pain 06/01/2020   Cholelithiasis    CKD stage 3 due to type 2 diabetes mellitus (HCC) 06/20/2020   COVID-19 03/18/2020   Dyspnea on exertion 06/19/2020   Elevated alkaline phosphatase level    Elevated liver enzymes 08/03/2020   Last Assessment & Plan:  Formatting of this note might be different from the original. Patient with elevated liver chemistries in a mixed hepatic and cholestatic pattern.  Evaluation for viral hepatitis was negative as was work-up for autoimmune and genetic liver disease.  He was treated with vancomycin, Zosyn, and clindamycin in January 2022 following treatment of a left groin abscess.  It is dif   Elevated liver function tests    Hepatomegaly 06/21/2020   HLD (hyperlipidemia)    Hyperkalemia    Hypertension    Hypertension associated with diabetes (HCC) 08/03/2020   LFTs abnormal    Post-COVID chronic fatigue    Pruritus 08/03/2020   Last Assessment & Plan:  Formatting of this note might be different from the original. Patient with pruritus secondary to cholestasis.  He does have some relief with colestipol and hydroxyzine.  Reviewed other interventions to help reduce pruritus including lukewarm baths, good emollient lotion, and oatmeal baths.   Renal insufficiency 06/19/2020   Screening for depression    Type 2 diabetes mellitus with proteinuria [facility])     Past Surgical History:  Procedure Laterality Date   LEG SURGERY  02/2020    Current Medications: Current Meds  Medication Sig   colestipol (COLESTID) 1 g tablet Take 2 tablets (2 g total) by mouth 2 (two) times daily.   empagliflozin (JARDIANCE) 25 MG TABS tablet Take 25 mg by mouth daily.   hydrOXYzine (ATARAX/VISTARIL) 25 MG tablet Take 25 mg by mouth 3 (three) times daily as needed for itching.   LANTUS SOLOSTAR 100 UNIT/ML Solostar Pen Inject 30 Units into the skin daily. (Patient  taking differently: Inject 25 Units into the skin daily.)   nitroGLYCERIN (NITROSTAT) 0.4 MG SL tablet Place 0.4 mg under the tongue every 5 (five) minutes as needed for chest pain.   Vitamin D, Ergocalciferol, (DRISDOL) 1.25 MG (50000 UNIT) CAPS capsule Take 1 capsule (50,000 Units total) by mouth every 7 (seven) days.     Allergies:   Patient has no known allergies.   Social History   Socioeconomic History   Marital status: Single    Spouse name: Not on file   Number of children: Not on file   Years of education: Not on file   Highest education level: Not on file  Occupational History   Not on file  Tobacco Use   Smoking status: Never   Smokeless tobacco: Never  Vaping Use   Vaping Use: Never used  Substance and Sexual Activity   Alcohol use: Yes    Comment: occ   Drug use: Never   Sexual activity: Not on file  Other Topics Concern   Not on file  Social History Narrative   Not on file   Social Determinants of Health   Financial Resource Strain: Not on file  Food Insecurity: Not on file  Transportation Needs: Not on file  Physical Activity: Not on file  Stress: Not on file  Social Connections: Not on file     Family History: The patient's family history includes Colon cancer in his maternal uncle; Diabetes in his father and mother; Heart disease in his father; Hyperlipidemia in his father and mother; Hypertension in his father and mother; Liver disease in his father and mother. There is no history of Esophageal cancer or Pancreatic cancer.  ROS:   Please see the history of present illness.    All other systems reviewed and are negative.  EKGs/Labs/Other Studies Reviewed:    The following studies were reviewed today: IMPRESSIONS     1. Left ventricular ejection fraction, by estimation, is 50 to 55%. The  left ventricle has low normal function. The left ventricle has no regional  wall motion abnormalities. Left ventricular diastolic parameters are  consistent  with Grade I diastolic  dysfunction (impaired relaxation).   2. Right ventricular systolic function is normal. The right ventricular  size is normal. There is normal pulmonary artery systolic pressure.   3. The mitral valve is grossly normal. Trivial mitral valve  regurgitation.   4. There is a thickened calcification on the LCC. No previous echo is  available for review. . The aortic valve is grossly normal. Aortic valve  regurgitation is not visualized.    Recent Labs: 06/19/2020: B Natriuretic Peptide 13.6; Magnesium 2.3 06/20/2020: TSH 4.652 06/23/2020: BUN 32; Creatinine, Ser 1.76; Potassium 5.1; Sodium 134 07/24/2020: ALT 91; Hemoglobin 12.2; Platelets 297.0  Recent Lipid Panel    Component Value Date/Time   CHOL 512 (H) 06/20/2020 0145   TRIG 474 (H) 06/20/2020 0145   HDL 51 06/20/2020 0145   CHOLHDL 10.0 06/20/2020 0145   VLDL UNABLE TO CALCULATE IF TRIGLYCERIDE OVER  400 mg/dL 09/73/5329 9242   LDLCALC UNABLE TO CALCULATE IF TRIGLYCERIDE OVER 400 mg/dL 68/34/1962 2297   LDLDIRECT 281.2 (H) 06/20/2020 0145    Physical Exam:    VS:  BP (!) 146/76   Pulse 88   Ht 5\' 5"  (1.651 m)   Wt 158 lb 3.2 oz (71.8 kg)   SpO2 98%   BMI 26.33 kg/m     Wt Readings from Last 3 Encounters:  08/25/20 158 lb 3.2 oz (71.8 kg)  07/24/20 160 lb (72.6 kg)  06/23/20 160 lb 4.8 oz (72.7 kg)     GEN: Patient is in no acute distress HEENT: Normal NECK: No JVD; No carotid bruits LYMPHATICS: No lymphadenopathy CARDIAC: Hear sounds regular, 2/6 systolic murmur at the apex. RESPIRATORY:  Clear to auscultation without rales, wheezing or rhonchi  ABDOMEN: Soft, non-tender, non-distended MUSCULOSKELETAL:  No edema; No deformity  SKIN: Warm and dry NEUROLOGIC:  Alert and oriented x 3 PSYCHIATRIC:  Normal affect   Signed, 06/25/20, MD  08/25/2020 4:39 PM    [facility]

## 2020-09-07 NOTE — Addendum Note (Signed)
Addended by: Eleonore Chiquito on: 09/07/2020 08:15 AM   Modules accepted: Orders

## 2020-09-08 ENCOUNTER — Telehealth: Payer: Self-pay

## 2020-09-08 NOTE — Telephone Encounter (Signed)
Called patient using [employer]rep[employer] Joclyn id number [phone] left a voiceml for pt to callback regarding CT scan.

## 2020-09-12 ENCOUNTER — Encounter: Payer: Self-pay | Admitting: Cardiology

## 2020-09-12 ENCOUNTER — Ambulatory Visit (HOSPITAL_COMMUNITY): Admission: RE | Admit: 2020-09-12 | Payer: Medicaid Other | Source: Ambulatory Visit

## 2020-09-12 ENCOUNTER — Other Ambulatory Visit: Payer: Self-pay

## 2020-09-12 ENCOUNTER — Ambulatory Visit (INDEPENDENT_AMBULATORY_CARE_PROVIDER_SITE_OTHER): Payer: Medicaid Other | Admitting: Cardiology

## 2020-09-12 VITALS — BP 140/84 | HR 94 | Ht 65.0 in | Wt 158.0 lb

## 2020-09-12 DIAGNOSIS — N289 Disorder of kidney and ureter, unspecified: Secondary | ICD-10-CM

## 2020-09-12 DIAGNOSIS — E1159 Type 2 diabetes mellitus with other circulatory complications: Secondary | ICD-10-CM

## 2020-09-12 DIAGNOSIS — I152 Hypertension secondary to endocrine disorders: Secondary | ICD-10-CM

## 2020-09-12 NOTE — Patient Instructions (Signed)

## 2020-09-12 NOTE — H&P (View-Only) (Signed)
Cardiology Office Note:    Date:  09/12/2020   ID:  Joshua Nash, DOB 27-Feb-1966, MRN [id number]  PCP:  Charlott Rakes, MD  Cardiologist:  Garwin Brothers, MD   Referring MD: Charlott Rakes, MD    ASSESSMENT:    1. Hypertension associated with diabetes (HCC)   2. Renal insufficiency    PLAN:    In order of problems listed above:  Primary prevention stressed with the patient.  Importance of compliance with diet medication stressed any vocalized understanding. Chest pain: The symptoms have resolved.  Patient is walking regularly without any symptoms.  I am hesitant to send him for coronary angiography with CT scanning because he has significantly advanced renal insufficiency and advanced liver problems.  In view of this he is at high risk for renal failure and consequent complications.  I discussed this with him at length.  At this point his effort tolerance appears to be fine.  He is at intermediate risk for coronary events during the aforementioned surgery.  He plans to undergo endoscopy and I told him to have his physician give me a call so we can discuss this at length with his doctor.  This is if endoscopy is planned.  I would like to know what is the extent of the procedure especially with a recent upper or lower endoscopy and other attendant procedures that will be done with this procedure.  He understands Renal insufficiency: Stable at this time and followed by primary care. Diabetes mellitus: Diet emphasized.  These issues are handled by primary care. Patient will be seen in follow-up appointment in 3 months or earlier if the patient has any concerns    Medication Adjustments/Labs and Tests Ordered: Current medicines are reviewed at length with the patient today.  Concerns regarding medicines are outlined above.  No orders of the defined types were placed in this encounter.  No orders of the defined types were placed in this encounter.    No chief complaint  on file.    History of Present Illness:    Joshua Nash is a 55 y.o. male.  Patient has history of cirrhosis and significant advanced renal insufficiency.  Initially the plan was to have him undergo CT coronary angiography with FFR with the possibility of him having symptoms resembling angina.  The symptoms have resolved and he walks 15 minutes on a regular basis without any problems.  No chest pain orthopnea or PND.  He recently was admitted to the hospital and treated for abscess.  At the time of my evaluation, the patient is alert awake oriented and in no distress.  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 Patient with elevated liver chemistries in a mixed hepatic and cholestatic pattern.  Evaluation for viral hepatitis was negative as was work-up for autoimmune and genetic liver disease.  He was treated with vancomycin, Zosyn, and clindamycin in January  2022 following treatment of a left groin abscess.  It is dif   Elevated liver function tests    Hepatomegaly 06/21/2020   HLD (hyperlipidemia)    Hyperkalemia    Hypertension    Hypertension associated with diabetes (HCC) 08/03/2020   LFTs abnormal    Post-COVID chronic fatigue    Pruritus 08/03/2020   Last Assessment & Plan:  Formatting of this note might be different from the original. Patient with pruritus secondary to cholestasis.  He does  have some relief with colestipol and hydroxyzine.  Reviewed other interventions to help reduce pruritus including lukewarm baths, good emollient lotion, and oatmeal baths.   Renal insufficiency 06/19/2020   Screening for depression    Type 2 diabetes mellitus with proteinuria [facility])     Past Surgical History:  Procedure Laterality Date   LEG SURGERY  02/2020    Current Medications: Current Meds  Medication Sig   cephALEXin (KEFLEX) 500 MG capsule Take 500 mg by mouth 4 (four) times daily.   colestipol (COLESTID) 1 g tablet Take 2 tablets (2 g total) by mouth 2 (two) times daily.   doxepin (SINEQUAN) 25 MG capsule Take 25 mg by mouth at bedtime.   empagliflozin (JARDIANCE) 25 MG TABS tablet Take 25 mg by mouth daily.   hydrOXYzine (ATARAX/VISTARIL) 25 MG tablet Take 25 mg by mouth 3 (three) times daily as needed for itching.   LANTUS SOLOSTAR 100 UNIT/ML Solostar Pen Inject 30 Units into the skin daily. (Patient taking differently: Inject 25 Units into the skin daily.)   metoprolol tartrate (LOPRESSOR) 25 MG tablet Take 12.5 mg by mouth 2 (two) times daily.   nitroGLYCERIN (NITROSTAT) 0.4 MG SL tablet Place 0.4 mg under the tongue every 5 (five) minutes as needed for chest pain.   Vitamin D, Ergocalciferol, (DRISDOL) 1.25 MG (50000 UNIT) CAPS capsule Take 1 capsule (50,000 Units total) by mouth every 7 (seven) days.     Allergies:   Patient has no known allergies.   Social History   Socioeconomic History   Marital status: Single    Spouse name: Not on file   Number of children: Not on file   Years of education: Not on file   Highest education level: Not on file  Occupational History   Not on file  Tobacco Use   Smoking status: Never   Smokeless tobacco: Never  Vaping Use   Vaping Use: Never used  Substance and Sexual Activity   Alcohol use: Yes    Comment: occ   Drug use: Never   Sexual activity: Not on file  Other Topics Concern   Not on file  Social History Narrative    Not on file   Social Determinants of Health   Financial Resource Strain: Not on file  Food Insecurity: Not on file  Transportation Needs: Not on file  Physical Activity: Not on file  Stress: Not on file  Social Connections: Not on file     Family History: The patient's family history includes Colon cancer in his maternal uncle; Diabetes in his father and mother; Heart disease in his father; Hyperlipidemia in his father and mother; Hypertension in his father and mother; Liver disease in his father and mother. There is no history of Esophageal cancer or Pancreatic cancer.  ROS:   Please see the history of present illness.    All other systems reviewed and are negative.  EKGs/Labs/Other Studies Reviewed:    The following studies were reviewed today: I discussed my findings  with the patient at length.   Recent Labs: 06/19/2020: B Natriuretic Peptide 13.6; Magnesium 2.3 06/20/2020: TSH 4.652 06/23/2020: BUN 32; Creatinine, Ser 1.76; Potassium 5.1; Sodium 134 07/24/2020: ALT 91; Hemoglobin 12.2; Platelets 297.0  Recent Lipid Panel    Component Value Date/Time   CHOL 512 (H) 06/20/2020 0145   TRIG 474 (H) 06/20/2020 0145   HDL 51 06/20/2020 0145   CHOLHDL 10.0 06/20/2020 0145   VLDL UNABLE TO CALCULATE IF TRIGLYCERIDE OVER 400 mg/dL 60/12/9321 5573   LDLCALC UNABLE TO CALCULATE IF TRIGLYCERIDE OVER 400 mg/dL 22/04/5425 0623   LDLDIRECT 281.2 (H) 06/20/2020 0145    Physical Exam:    VS:  BP 140/84   Pulse 94   Ht 5\' 5"  (1.651 m)   Wt 158 lb (71.7 kg)   SpO2 98%   BMI 26.29 kg/m     Wt Readings from Last 3 Encounters:  09/12/20 158 lb (71.7 kg)  08/25/20 158 lb 3.2 oz (71.8 kg)  07/24/20 160 lb (72.6 kg)     GEN: Patient is in no acute distress HEENT: Normal NECK: No JVD; No carotid bruits LYMPHATICS: No lymphadenopathy CARDIAC: Hear sounds regular, 2/6 systolic murmur at the apex. RESPIRATORY:  Clear to auscultation without rales, wheezing or rhonchi  ABDOMEN:  Soft, non-tender, non-distended MUSCULOSKELETAL:  No edema; No deformity  SKIN: Warm and dry NEUROLOGIC:  Alert and oriented x 3 PSYCHIATRIC:  Normal affect   Signed, 07/26/20, MD  09/12/2020 1:14 PM    [facility]

## 2020-09-12 NOTE — Progress Notes (Signed)
Cardiology Office Note:    Date:  09/12/2020   ID:  Joshua Nash, DOB 27-Feb-1966, MRN [id number]  PCP:  Charlott Rakes, MD  Cardiologist:  Garwin Brothers, MD   Referring MD: Charlott Rakes, MD    ASSESSMENT:    1. Hypertension associated with diabetes (HCC)   2. Renal insufficiency    PLAN:    In order of problems listed above:  Primary prevention stressed with the patient.  Importance of compliance with diet medication stressed any vocalized understanding. Chest pain: The symptoms have resolved.  Patient is walking regularly without any symptoms.  I am hesitant to send him for coronary angiography with CT scanning because he has significantly advanced renal insufficiency and advanced liver problems.  In view of this he is at high risk for renal failure and consequent complications.  I discussed this with him at length.  At this point his effort tolerance appears to be fine.  He is at intermediate risk for coronary events during the aforementioned surgery.  He plans to undergo endoscopy and I told him to have his physician give me a call so we can discuss this at length with his doctor.  This is if endoscopy is planned.  I would like to know what is the extent of the procedure especially with a recent upper or lower endoscopy and other attendant procedures that will be done with this procedure.  He understands Renal insufficiency: Stable at this time and followed by primary care. Diabetes mellitus: Diet emphasized.  These issues are handled by primary care. Patient will be seen in follow-up appointment in 3 months or earlier if the patient has any concerns    Medication Adjustments/Labs and Tests Ordered: Current medicines are reviewed at length with the patient today.  Concerns regarding medicines are outlined above.  No orders of the defined types were placed in this encounter.  No orders of the defined types were placed in this encounter.    No chief complaint  on file.    History of Present Illness:    Joshua Nash is a 55 y.o. male.  Patient has history of cirrhosis and significant advanced renal insufficiency.  Initially the plan was to have him undergo CT coronary angiography with FFR with the possibility of him having symptoms resembling angina.  The symptoms have resolved and he walks 15 minutes on a regular basis without any problems.  No chest pain orthopnea or PND.  He recently was admitted to the hospital and treated for abscess.  At the time of my evaluation, the patient is alert awake oriented and in no distress.  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 Patient with elevated liver chemistries in a mixed hepatic and cholestatic pattern.  Evaluation for viral hepatitis was negative as was work-up for autoimmune and genetic liver disease.  He was treated with vancomycin, Zosyn, and clindamycin in January  2022 following treatment of a left groin abscess.  It is dif   Elevated liver function tests    Hepatomegaly 06/21/2020   HLD (hyperlipidemia)    Hyperkalemia    Hypertension    Hypertension associated with diabetes (HCC) 08/03/2020   LFTs abnormal    Post-COVID chronic fatigue    Pruritus 08/03/2020   Last Assessment & Plan:  Formatting of this note might be different from the original. Patient with pruritus secondary to cholestasis.  He does  have some relief with colestipol and hydroxyzine.  Reviewed other interventions to help reduce pruritus including lukewarm baths, good emollient lotion, and oatmeal baths.   Renal insufficiency 06/19/2020   Screening for depression    Type 2 diabetes mellitus with proteinuria [facility])     Past Surgical History:  Procedure Laterality Date   LEG SURGERY  02/2020    Current Medications: Current Meds  Medication Sig   cephALEXin (KEFLEX) 500 MG capsule Take 500 mg by mouth 4 (four) times daily.   colestipol (COLESTID) 1 g tablet Take 2 tablets (2 g total) by mouth 2 (two) times daily.   doxepin (SINEQUAN) 25 MG capsule Take 25 mg by mouth at bedtime.   empagliflozin (JARDIANCE) 25 MG TABS tablet Take 25 mg by mouth daily.   hydrOXYzine (ATARAX/VISTARIL) 25 MG tablet Take 25 mg by mouth 3 (three) times daily as needed for itching.   LANTUS SOLOSTAR 100 UNIT/ML Solostar Pen Inject 30 Units into the skin daily. (Patient taking differently: Inject 25 Units into the skin daily.)   metoprolol tartrate (LOPRESSOR) 25 MG tablet Take 12.5 mg by mouth 2 (two) times daily.   nitroGLYCERIN (NITROSTAT) 0.4 MG SL tablet Place 0.4 mg under the tongue every 5 (five) minutes as needed for chest pain.   Vitamin D, Ergocalciferol, (DRISDOL) 1.25 MG (50000 UNIT) CAPS capsule Take 1 capsule (50,000 Units total) by mouth every 7 (seven) days.     Allergies:   Patient has no known allergies.   Social History   Socioeconomic History   Marital status: Single    Spouse name: Not on file   Number of children: Not on file   Years of education: Not on file   Highest education level: Not on file  Occupational History   Not on file  Tobacco Use   Smoking status: Never   Smokeless tobacco: Never  Vaping Use   Vaping Use: Never used  Substance and Sexual Activity   Alcohol use: Yes    Comment: occ   Drug use: Never   Sexual activity: Not on file  Other Topics Concern   Not on file  Social History Narrative    Not on file   Social Determinants of Health   Financial Resource Strain: Not on file  Food Insecurity: Not on file  Transportation Needs: Not on file  Physical Activity: Not on file  Stress: Not on file  Social Connections: Not on file     Family History: The patient's family history includes Colon cancer in his maternal uncle; Diabetes in his father and mother; Heart disease in his father; Hyperlipidemia in his father and mother; Hypertension in his father and mother; Liver disease in his father and mother. There is no history of Esophageal cancer or Pancreatic cancer.  ROS:   Please see the history of present illness.    All other systems reviewed and are negative.  EKGs/Labs/Other Studies Reviewed:    The following studies were reviewed today: I discussed my findings  with the patient at length.   Recent Labs: 06/19/2020: B Natriuretic Peptide 13.6; Magnesium 2.3 06/20/2020: TSH 4.652 06/23/2020: BUN 32; Creatinine, Ser 1.76; Potassium 5.1; Sodium 134 07/24/2020: ALT 91; Hemoglobin 12.2; Platelets 297.0  Recent Lipid Panel    Component Value Date/Time   CHOL 512 (H) 06/20/2020 0145   TRIG 474 (H) 06/20/2020 0145   HDL 51 06/20/2020 0145   CHOLHDL 10.0 06/20/2020 0145   VLDL UNABLE TO CALCULATE IF TRIGLYCERIDE OVER 400 mg/dL 60/12/9321 5573   LDLCALC UNABLE TO CALCULATE IF TRIGLYCERIDE OVER 400 mg/dL 22/04/5425 0623   LDLDIRECT 281.2 (H) 06/20/2020 0145    Physical Exam:    VS:  BP 140/84   Pulse 94   Ht 5\' 5"  (1.651 m)   Wt 158 lb (71.7 kg)   SpO2 98%   BMI 26.29 kg/m     Wt Readings from Last 3 Encounters:  09/12/20 158 lb (71.7 kg)  08/25/20 158 lb 3.2 oz (71.8 kg)  07/24/20 160 lb (72.6 kg)     GEN: Patient is in no acute distress HEENT: Normal NECK: No JVD; No carotid bruits LYMPHATICS: No lymphadenopathy CARDIAC: Hear sounds regular, 2/6 systolic murmur at the apex. RESPIRATORY:  Clear to auscultation without rales, wheezing or rhonchi  ABDOMEN:  Soft, non-tender, non-distended MUSCULOSKELETAL:  No edema; No deformity  SKIN: Warm and dry NEUROLOGIC:  Alert and oriented x 3 PSYCHIATRIC:  Normal affect   Signed, 07/26/20, MD  09/12/2020 1:14 PM    [facility]

## 2020-09-20 NOTE — Anesthesia Preprocedure Evaluation (Addendum)
Anesthesia Evaluation  Patient identified by MRN, date of birth, ID band Patient awake    Reviewed: Allergy & Precautions, NPO status , Patient's Chart, lab work & pertinent test results  Airway Mallampati: III  TM Distance: >3 FB Neck ROM: Full    Dental no notable dental hx. (+) Teeth Intact, Dental Advisory Given   Pulmonary neg pulmonary ROS,    Pulmonary exam normal breath sounds clear to auscultation       Cardiovascular hypertension, Pt. on medications + angina (05/2020- ) +CHF (LVEF 50-55%, grade 1 diastolic dysfunction)  Normal cardiovascular exam Rhythm:Regular Rate:Normal  Deemed intermediate risk by cardiology, who is hesitant to send him for cath d/t advanced kidney and liver dx. Chest pain in March 2022 has resolved  Echo 06/2020: 1. Left ventricular ejection fraction, by estimation, is 50 to 55%. The  left ventricle has low normal function. The left ventricle has no regional  wall motion abnormalities. Left ventricular diastolic parameters are  consistent with Grade I diastolic  dysfunction (impaired relaxation).  2. Right ventricular systolic function is normal. The right ventricular  size is normal. There is normal pulmonary artery systolic pressure.  3. The mitral valve is grossly normal. Trivial mitral valve  regurgitation.  4. There is a thickened calcification on the LCC. No previous echo is  available for review. . The aortic valve is grossly normal. Aortic valve  regurgitation is not visualized.    Neuro/Psych negative neurological ROS  negative psych ROS   GI/Hepatic negative GI ROS, (+)     substance abuse  alcohol use,   Endo/Other  diabetes, Poorly Controlled, Type 2, Oral Hypoglycemic Agents, Insulin DependentLast night 7pm took 22 units (1/2 normal) 368 this morning a1c 12.8  Renal/GU Renal disease (CKD 3)Cr 1.76  negative genitourinary   Musculoskeletal negative musculoskeletal  ROS (+)   Abdominal   Peds  Hematology negative hematology ROS (+)   Anesthesia Other Findings   Reproductive/Obstetrics negative OB ROS                            Anesthesia Physical Anesthesia Plan  ASA: 3  Anesthesia Plan: General   Post-op Pain Management:    Induction: Intravenous  PONV Risk Score and Plan: 2 and Ondansetron, Dexamethasone and Treatment may vary due to age or medical condition  Airway Management Planned: Oral ETT  Additional Equipment: None  Intra-op Plan:   Post-operative Plan: Extubation in OR  Informed Consent: I have reviewed the patients History and Physical, chart, labs and discussed the procedure including the risks, benefits and alternatives for the proposed anesthesia with the patient or authorized representative who has indicated his/her understanding and acceptance.     Dental advisory given  Plan Discussed with: CRNA  Anesthesia Plan Comments:        Anesthesia Quick Evaluation

## 2020-09-21 ENCOUNTER — Encounter (HOSPITAL_COMMUNITY): Payer: Self-pay | Admitting: Gastroenterology

## 2020-09-21 ENCOUNTER — Ambulatory Visit (HOSPITAL_COMMUNITY)
Admission: RE | Admit: 2020-09-21 | Discharge: 2020-09-21 | Disposition: A | Discharge status: Home / Self Care | Payer: Medicaid Other | Attending: Gastroenterology | Admitting: Gastroenterology

## 2020-09-21 ENCOUNTER — Ambulatory Visit (HOSPITAL_COMMUNITY): Payer: Medicaid Other | Admitting: Anesthesiology

## 2020-09-21 ENCOUNTER — Encounter (HOSPITAL_COMMUNITY)
Admission: RE | Disposition: A | Discharge status: Home / Self Care | Payer: Self-pay | Source: Home / Self Care | Attending: Gastroenterology

## 2020-09-21 ENCOUNTER — Ambulatory Visit (HOSPITAL_COMMUNITY): Payer: Medicaid Other

## 2020-09-21 ENCOUNTER — Other Ambulatory Visit: Payer: Self-pay

## 2020-09-21 DIAGNOSIS — Z8616 Personal history of COVID-19: Secondary | ICD-10-CM | POA: Diagnosis not present

## 2020-09-21 DIAGNOSIS — Z79899 Other long term (current) drug therapy: Secondary | ICD-10-CM | POA: Insufficient documentation

## 2020-09-21 DIAGNOSIS — Z794 Long term (current) use of insulin: Secondary | ICD-10-CM | POA: Insufficient documentation

## 2020-09-21 DIAGNOSIS — R17 Unspecified jaundice: Secondary | ICD-10-CM | POA: Diagnosis present

## 2020-09-21 DIAGNOSIS — L299 Pruritus, unspecified: Secondary | ICD-10-CM | POA: Insufficient documentation

## 2020-09-21 DIAGNOSIS — R7989 Other specified abnormal findings of blood chemistry: Secondary | ICD-10-CM

## 2020-09-21 DIAGNOSIS — E1169 Type 2 diabetes mellitus with other specified complication: Secondary | ICD-10-CM | POA: Insufficient documentation

## 2020-09-21 DIAGNOSIS — I129 Hypertensive chronic kidney disease with stage 1 through stage 4 chronic kidney disease, or unspecified chronic kidney disease: Secondary | ICD-10-CM | POA: Insufficient documentation

## 2020-09-21 DIAGNOSIS — E1122 Type 2 diabetes mellitus with diabetic chronic kidney disease: Secondary | ICD-10-CM | POA: Diagnosis not present

## 2020-09-21 DIAGNOSIS — K8051 Calculus of bile duct without cholangitis or cholecystitis with obstruction: Secondary | ICD-10-CM | POA: Diagnosis not present

## 2020-09-21 DIAGNOSIS — K7460 Unspecified cirrhosis of liver: Secondary | ICD-10-CM | POA: Diagnosis not present

## 2020-09-21 DIAGNOSIS — L298 Other pruritus: Secondary | ICD-10-CM

## 2020-09-21 DIAGNOSIS — K831 Obstruction of bile duct: Secondary | ICD-10-CM | POA: Diagnosis not present

## 2020-09-21 DIAGNOSIS — K8050 Calculus of bile duct without cholangitis or cholecystitis without obstruction: Secondary | ICD-10-CM

## 2020-09-21 DIAGNOSIS — N183 Chronic kidney disease, stage 3 unspecified: Secondary | ICD-10-CM | POA: Diagnosis not present

## 2020-09-21 HISTORY — PX: ENDOSCOPIC RETROGRADE CHOLANGIOPANCREATOGRAPHY (ERCP) WITH PROPOFOL: SHX5810

## 2020-09-21 HISTORY — PX: ESOPHAGOGASTRODUODENOSCOPY (EGD) WITH PROPOFOL: SHX5813

## 2020-09-21 HISTORY — PX: SPHINCTEROTOMY: SHX5279

## 2020-09-21 HISTORY — PX: BILIARY BRUSHING: SHX6843

## 2020-09-21 HISTORY — PX: EUS: SHX5427

## 2020-09-21 LAB — GLUCOSE, CAPILLARY
Glucose-Capillary: 368 mg/dL — ABNORMAL HIGH (ref 70–99)
Glucose-Capillary: 329 mg/dL — ABNORMAL HIGH (ref 70–99)
Glucose-Capillary: 366 mg/dL — ABNORMAL HIGH (ref 70–99)
Glucose-Capillary: 233 mg/dL — ABNORMAL HIGH (ref 70–99)

## 2020-09-21 LAB — HEPATIC FUNCTION PANEL
ALT: 48 U/L — ABNORMAL HIGH (ref 0–44)
AST: 51 U/L — ABNORMAL HIGH (ref 15–41)
Albumin: 2.8 g/dL — ABNORMAL LOW (ref 3.5–5.0)
Alkaline Phosphatase: 690 U/L — ABNORMAL HIGH (ref 38–126)
Bilirubin, Direct: 0.6 mg/dL — ABNORMAL HIGH (ref 0.0–0.2)
Indirect Bilirubin: 0.6 mg/dL (ref 0.3–0.9)
Total Bilirubin: 1.2 mg/dL (ref 0.3–1.2)
Total Protein: 7 g/dL (ref 6.5–8.1)

## 2020-09-21 IMAGING — RF DG ERCP WO/W SPHINCTEROTOMY
1 series · 11 of 11 positions shown · non-contrast
Comparison: None.

CLINICAL DATA: Choledocholithiasis.  Sphincterotomy.

EXAM:
ERCP
TECHNIQUE: Multiple spot images obtained with the fluoroscopic device and
submitted for interpretation post-procedure.
FLUOROSCOPY TIME:  Fluoroscopy Time:  1 minutes 54 seconds
Radiation Exposure Index (if provided by the fluoroscopic device):
36 mGy
Number of Acquired Spot Images: 7

[Series 1: run · 8 acquisitions, 11 frames shown]
[im 1/8]
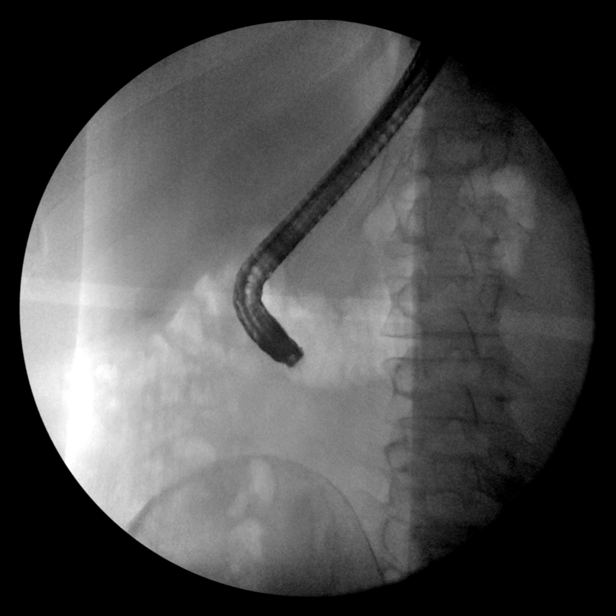
[im 2/8]
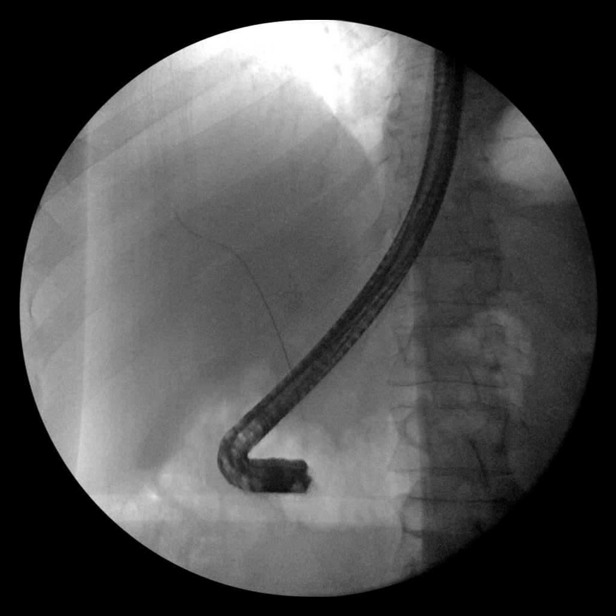
[im 3/8]
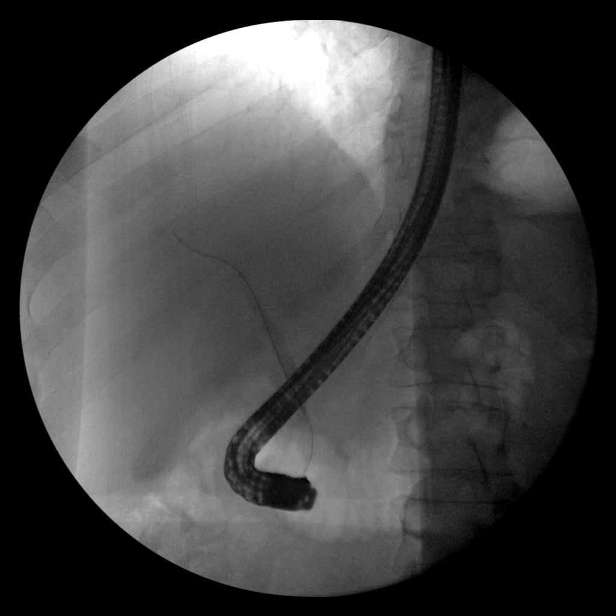
[im 3/8]
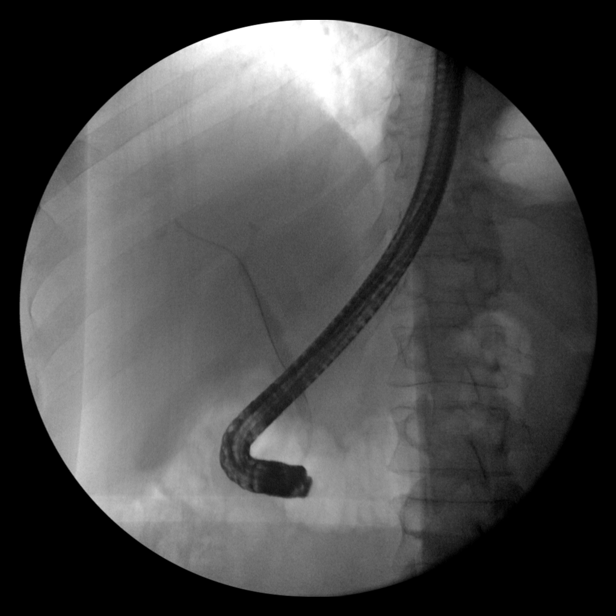
[im 3/8]
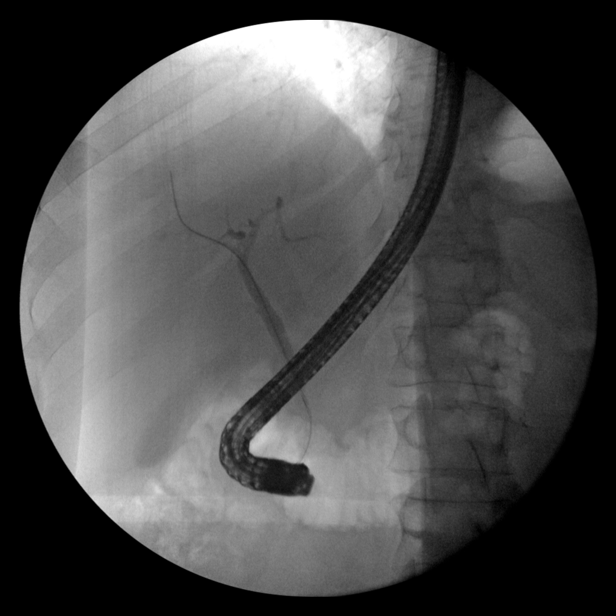
[im 3/8]
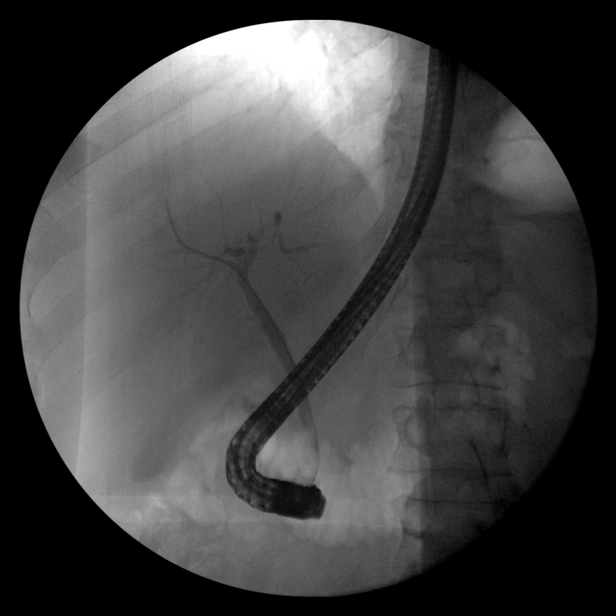
[im 4/8]
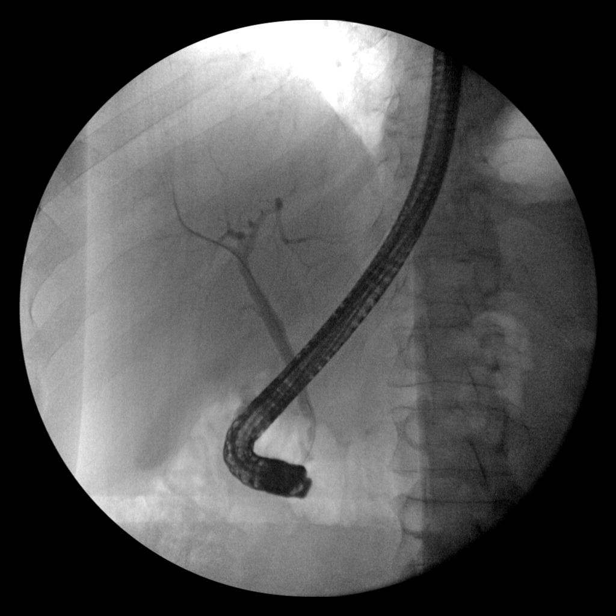
[im 5/8]
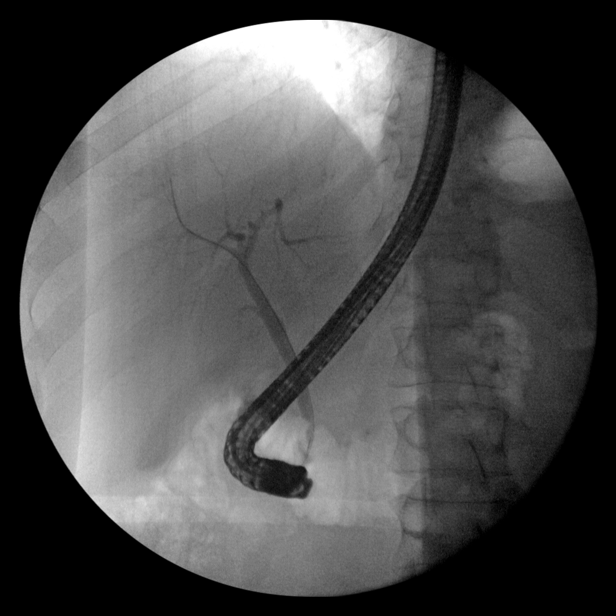
[im 6/8]
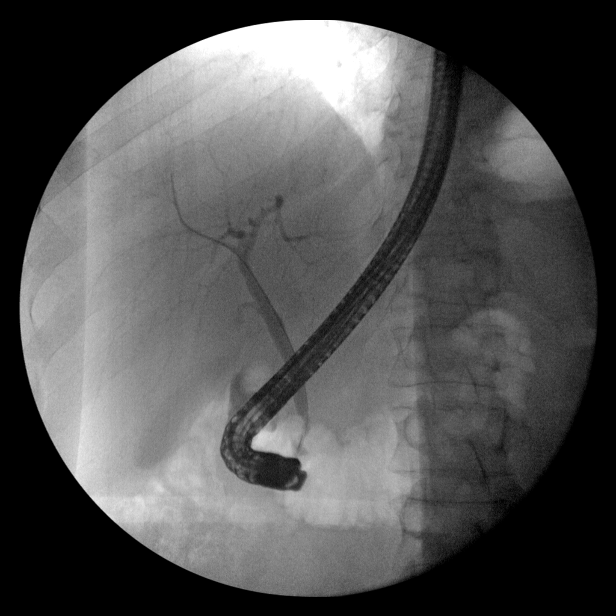
[im 7/8]
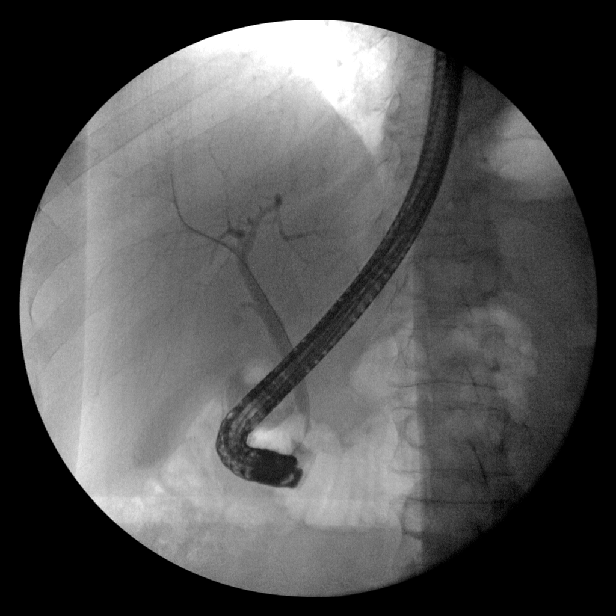
[im 8/8]
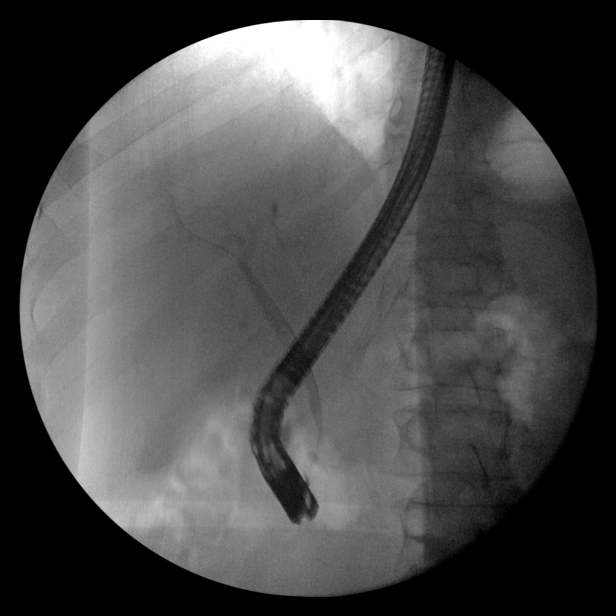

[11 of 11 positions shown; findings below may reference images not displayed]

FINDINGS: Submitted intraoperative fluoroscopic images demonstrate cannulation
and opacification of the intra and extrahepatic bile ducts. There is
a triangular filling defect in the common bile duct just above the
level of the sphincter of Oddi, which becomes less conspicuous
throughout the study. The exact significance of this finding is not
certain.
IMPRESSION: ERCP findings as above.

These results were called by telephone at the time of interpretation
on [DATE] at [DATE] to provider MERLOS , who verbally
acknowledged these results.

These images were submitted for radiologic interpretation only.
Please see the procedural report for the amount of contrast and the
fluoroscopy time utilized.

## 2020-09-21 SURGERY — ESOPHAGOGASTRODUODENOSCOPY (EGD) WITH PROPOFOL
Anesthesia: General

## 2020-09-21 MED ORDER — PROPOFOL 1000 MG/100ML IV EMUL
INTRAVENOUS | Status: AC
  Filled 2020-09-21: qty 100

## 2020-09-21 MED ORDER — FENTANYL CITRATE (PF) 100 MCG/2ML IJ SOLN
INTRAMUSCULAR | Status: AC
  Filled 2020-09-21: qty 2

## 2020-09-21 MED ORDER — FENTANYL CITRATE (PF) 100 MCG/2ML IJ SOLN
INTRAMUSCULAR | Status: DC | PRN
  Administered 2020-09-21: 100 ug via INTRAVENOUS

## 2020-09-21 MED ORDER — PROPOFOL 10 MG/ML IV BOLUS
INTRAVENOUS | Status: DC | PRN
  Administered 2020-09-21: 200 mg via INTRAVENOUS

## 2020-09-21 MED ORDER — GLUCAGON HCL RDNA (DIAGNOSTIC) 1 MG IJ SOLR
INTRAMUSCULAR | Status: AC
  Filled 2020-09-21: qty 1

## 2020-09-21 MED ORDER — PHENYLEPHRINE HCL (PRESSORS) 10 MG/ML IV SOLN
INTRAVENOUS | Status: AC
  Filled 2020-09-21: qty 1

## 2020-09-21 MED ORDER — CIPROFLOXACIN IN D5W 400 MG/200ML IV SOLN
INTRAVENOUS | Status: AC
  Filled 2020-09-21: qty 200

## 2020-09-21 MED ORDER — INSULIN ASPART 100 UNIT/ML IJ SOLN
INTRAMUSCULAR | Status: AC
  Filled 2020-09-21: qty 1

## 2020-09-21 MED ORDER — LACTATED RINGERS IV SOLN: INTRAVENOUS | Status: DC

## 2020-09-21 MED ORDER — PHENYLEPHRINE 40 MCG/ML (10ML) SYRINGE FOR IV PUSH (FOR BLOOD PRESSURE SUPPORT)
PREFILLED_SYRINGE | INTRAVENOUS | Status: DC | PRN
  Administered 2020-09-21: 120 ug via INTRAVENOUS
  Administered 2020-09-21: 80 ug via INTRAVENOUS
  Administered 2020-09-21 (×2): 120 ug via INTRAVENOUS

## 2020-09-21 MED ORDER — INDOMETHACIN 50 MG RE SUPP
RECTAL | Status: AC
  Filled 2020-09-21: qty 2

## 2020-09-21 MED ORDER — SUGAMMADEX SODIUM 200 MG/2ML IV SOLN
INTRAVENOUS | Status: DC | PRN
  Administered 2020-09-21: 140 mg via INTRAVENOUS

## 2020-09-21 MED ORDER — INDOMETHACIN 50 MG RE SUPP
RECTAL | Status: DC | PRN
  Administered 2020-09-21: 100 mg via RECTAL

## 2020-09-21 MED ORDER — METOPROLOL TARTRATE 12.5 MG HALF TABLET
12.5000 mg | ORAL_TABLET | Freq: Once | ORAL | Status: AC
  Administered 2020-09-21: 12.5 mg via ORAL
  Filled 2020-09-21: qty 1

## 2020-09-21 MED ORDER — ROCURONIUM BROMIDE 10 MG/ML (PF) SYRINGE
PREFILLED_SYRINGE | INTRAVENOUS | Status: DC | PRN
  Administered 2020-09-21: 60 mg via INTRAVENOUS

## 2020-09-21 MED ORDER — SODIUM CHLORIDE 0.9 % IV SOLN
INTRAVENOUS | Status: DC | PRN
  Administered 2020-09-21: 20 mL

## 2020-09-21 MED ORDER — INSULIN ASPART 100 UNIT/ML IJ SOLN
10.0000 [IU] | Freq: Once | INTRAMUSCULAR | Status: AC
  Administered 2020-09-21: 10 [IU] via SUBCUTANEOUS

## 2020-09-21 MED ORDER — LIDOCAINE 2% (20 MG/ML) 5 ML SYRINGE
INTRAMUSCULAR | Status: DC | PRN
  Administered 2020-09-21: 50 mg via INTRAVENOUS

## 2020-09-21 MED ORDER — DEXAMETHASONE SODIUM PHOSPHATE 10 MG/ML IJ SOLN
INTRAMUSCULAR | Status: DC | PRN
  Administered 2020-09-21: 4 mg via INTRAVENOUS

## 2020-09-21 MED ORDER — PHENYLEPHRINE HCL-NACL 10-0.9 MG/250ML-% IV SOLN
INTRAVENOUS | Status: DC | PRN
  Administered 2020-09-21: 30 ug/min via INTRAVENOUS

## 2020-09-21 MED ORDER — SODIUM CHLORIDE 0.9 % IV SOLN: INTRAVENOUS | Status: DC

## 2020-09-21 MED ORDER — ONDANSETRON HCL 4 MG/2ML IJ SOLN
INTRAMUSCULAR | Status: DC | PRN
  Administered 2020-09-21: 4 mg via INTRAVENOUS

## 2020-09-21 MED ORDER — PROPOFOL 10 MG/ML IV BOLUS
INTRAVENOUS | Status: AC
  Filled 2020-09-21: qty 20

## 2020-09-21 SURGICAL SUPPLY — 14 items

## 2020-09-21 NOTE — Op Note (Signed)
[facility] Patient Name: Joshua Nash Procedure Date: 09/21/2020 MRN: [id number] Attending MD: Milus Banister , MD Date of Birth: [birth year] CSN: [id number] Age: 55 Admit Type: Outpatient Procedure:                ERCP Indications:              Cholestatic jaundice. [facility] liver team                            review of MR suggested possible distal CBD                            stricture; LFTs this AM: alk phos 690, Dbili 0.6,                            AST 51, ALT 48 Providers:                Milus Banister, MD, Glori Bickers, RN, [place], Technician, Maudry Diego, CRNA Referring MD:             Zenovia Jarred, MD Medicines:                General Anesthesia, Indomethacin 239 mg PR Complications:            No immediate complications. Estimated blood loss:                            None Estimated Blood Loss:     Estimated blood loss: none. Procedure:                Pre-Anesthesia Assessment:                           - Prior to the procedure, a History and Physical                            was performed, and patient medications and                            allergies were reviewed. The patient's tolerance of                            previous anesthesia was also reviewed. The risks                            and benefits of the procedure and the sedation                            options and risks were discussed with the patient.                            All questions were answered, and informed consent  was obtained. Prior Anticoagulants: The patient has                            taken no previous anticoagulant or antiplatelet                            agents. ASA Grade Assessment: II - A patient with                            mild systemic disease. After reviewing the risks                            and benefits, the patient was deemed in                             satisfactory condition to undergo the procedure.                           After obtaining informed consent, the scope was                            passed under direct vision. Throughout the                            procedure, the patient's blood pressure, pulse, and                            oxygen saturations were monitored continuously. The                            ERCP [id number] was introduced through the mouth, and                            used to inject contrast into and used to cannulate                            the bile duct. The ERCP was accomplished without                            difficulty. The patient tolerated the procedure                            well. Scope In: Scope Out: Findings:      The scout film was normal. The duodenoscope was advanced to the region       of the major papilla which was completely normal-appearing. I used a 47       autotome over a 0.35 Hydra wire to cannulate the bile duct and injected       contrast. Cholangiogram revealed a nondilated extrahepatic biliary tree.       I estimated the common bile duct to measure 4 or 5 mm across. No filling       defects were noted. Right and left main hepatic ducts were normal. The       cystic duct briefly filled  with contrast. The most distal aspect of the       biliary tree was a bit tortuous and given this finding as well as       outside tertiary review of his recent MRI I elected to perform a limited       biliary sphincterotomy. I then swept the distal bile duct several times       with a 9 mm biliary balloon to assess for subtle stricture. The balloon       easily passed through the sphincterotomy site without hanging or       catching. Finally I used a biliary cytology brush to sample the distal       bile duct. There was fresh yellow bile around the major papilla prior to       any manipulation and contrast passed into the duodenum very easily. The       main pancreatic duct was  cannulated a single time with the wire but       never injected with dye. Impression:               - Normal-appearing extrahepatic biliary tree.                            Slightly tortuous most distal aspect of the CBD was                            extensively evaluated given tertiary concern for                            his recent MRI and prolonged cholestasis by                            performing a limited biliary sphincterotomy,                            sweeping the bile duct several times with a 9 mm                            biliary balloon and then sampling the most distal                            aspect of the bile duct with a biliary cytology                            brush. There was fresh yellow bile at the ampulla                            prior to any manipulation and contrast passed                            through the sphincterotomy site easily and so I did                            not place a biliary stent.                           -  I think that his cholestasis is most likely not                            related to an underlying obstructive process. Moderate Sedation:      Not Applicable - Patient had care per Anesthesia. Recommendation:           - Discharge patient to home.                           - Await final cytology results. Procedure Code(s):        --- Professional ---                           [phone], Endoscopic retrograde                            cholangiopancreatography (ERCP); with                            sphincterotomy/papillotomy Diagnosis Code(s):        --- Professional ---                           K83.1, Obstruction of bile duct CPT copyright 2019 American Medical Association. All rights reserved. The codes documented in this report are preliminary and upon coder review may  be revised to meet current compliance requirements. Milus Banister, MD 09/21/2020 9:15:50 AM This report has been signed electronically. Number of  Addenda: 0

## 2020-09-21 NOTE — H&P (Signed)
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  CBD question raised by [facility] liver team who suggested ERCP evaluation.  Last LFTs at outside hospital 6/29 showed normal Tbili, normal AST, normal ALT, alk phos in 800s  Getting LFTs this AM   ROS: complete GI ROS as described in HPI, all other review negative.  Constitutional:  No unintentional weight loss   Past Medical History:  Diagnosis Date   Abnormal electrocardiogram (ECG) (EKG)    Acute on chronic renal failure ([place]) 06/19/2020   AKI (acute kidney injury) ([place]) 06/21/2020   Angina pectoris ([place]) 06/01/2020   Calculus of gallbladder without cholecystitis without obstruction 08/03/2020   Cardiac mass 06/01/2020   Chest pain 06/01/2020   Cholelithiasis    CKD stage 3 due to type 2 diabetes mellitus ([place]) 06/20/2020   COVID-19 03/18/2020   Dyspnea on exertion 06/19/2020   Elevated alkaline phosphatase level    Elevated liver enzymes 08/03/2020   Last Assessment & Plan:  Formatting of this note might be different from the original. Patient with elevated liver chemistries in a mixed hepatic and cholestatic pattern.  Evaluation for viral hepatitis was negative as was work-up for autoimmune and genetic liver disease.  He was treated with vancomycin, Zosyn, and clindamycin in January 2022 following treatment of a left groin abscess.  It is dif   Elevated liver function tests    Hepatomegaly 06/21/2020   HLD (hyperlipidemia)    Hyperkalemia    Hypertension    Hypertension associated with diabetes ([place]) 08/03/2020   LFTs abnormal    Post-COVID chronic fatigue    Pruritus 08/03/2020   Last Assessment & Plan:  Formatting of this note might be different from the original. Patient with pruritus secondary to cholestasis.  He does have some relief with colestipol and hydroxyzine.  Reviewed other interventions to help reduce pruritus including lukewarm baths,  good emollient lotion, and oatmeal baths.   Renal insufficiency 06/19/2020   Screening for depression    Type 2 diabetes mellitus with proteinuria [facility])     Past Surgical History:  Procedure Laterality Date   LEG SURGERY  02/2020    Current Facility-Administered Medications  Medication Dose Route Frequency Provider Last Rate Last Admin   0.9 %  sodium chloride infusion   Intravenous Continuous Milus Banister, MD       lactated ringers infusion   Intravenous Continuous Milus Banister, MD 20 mL/hr at 09/21/20 0711 New Bag at 09/21/20 0711    Allergies as of 08/14/2020   (No Known Allergies)    Family History  Problem Relation Age of Onset   Diabetes Mother    Hyperlipidemia Mother    Hypertension Mother    Liver disease Mother    Diabetes Father    Hyperlipidemia Father    Hypertension Father    Heart disease Father    Liver disease Father    Colon cancer Maternal Uncle    Esophageal cancer Neg Hx    Pancreatic cancer Neg Hx     Social History   Socioeconomic History   Marital status: Single    Spouse name: Not on file   Number of children: Not on file   Years of education: Not on file   Highest education level: Not on file  Occupational History   Not on file  Tobacco Use   Smoking status: Never   Smokeless tobacco: Never  Vaping Use  Vaping Use: Never used  Substance and Sexual Activity   Alcohol use: Yes    Comment: occ   Drug use: Never   Sexual activity: Not on file  Other Topics Concern   Not on file  Social History Narrative   Not on file   Social Determinants of Health   Financial Resource Strain: Not on file  Food Insecurity: Not on file  Transportation Needs: Not on file  Physical Activity: Not on file  Stress: Not on file  Social Connections: Not on file  Intimate Partner Violence: Not on file     Physical Exam: BP (!) 161/91   Pulse 90   Temp 97.7 F (36.5 C) (Temporal)   Resp 12   Ht '5\' 5"'  (1.651 m)   Wt 67.1 kg   SpO2  99%   BMI 24.63 kg/m  Constitutional: generally well-appearing Psychiatric: alert and oriented x3 Abdomen: soft, nontender, nondistended, no obvious ascites, no peritoneal signs, normal bowel sounds No peripheral edema noted in lower extremities  Assessment and plan: 55 y.o. male with cholestatic liver disease, pruritis  [facility] liver team raised question of distal CBD stricture causing/contributing to his problems.  I was asked to perform ERCP for further evaluation.  He understands there are risks such as pancreatitis, perforation, infection, bleeding and agrees to proceed.  Please see the "Patient Instructions" section for addition details about the plan.  Owens Loffler, MD [facility] 09/21/2020, 7:25 AM

## 2020-09-21 NOTE — Anesthesia Postprocedure Evaluation (Signed)
Anesthesia Post Note  Patient: Joshua Nash  Procedure(s) Performed: ESOPHAGOGASTRODUODENOSCOPY (EGD) WITH PROPOFOL UPPER ENDOSCOPIC ULTRASOUND (EUS) RADIAL ENDOSCOPIC RETROGRADE CHOLANGIOPANCREATOGRAPHY (ERCP) WITH PROPOFOL SPHINCTEROTOMY BILIARY BRUSHING     Patient location during evaluation: PACU Anesthesia Type: MAC Level of consciousness: awake and alert Pain management: pain level controlled Vital Signs Assessment: post-procedure vital signs reviewed and stable Respiratory status: spontaneous breathing, nonlabored ventilation and respiratory function stable Cardiovascular status: blood pressure returned to baseline and stable Postop Assessment: no apparent nausea or vomiting Anesthetic complications: no   No notable events documented.  Last Vitals:  Vitals:   09/21/20 0656 09/21/20 0900  BP: (!) 161/91 (!) 197/103  Pulse: 90 82  Resp: 12 (!) 22  Temp: 36.5 C   SpO2: 99% 100%    Last Pain:  Vitals:   09/21/20 0940  TempSrc:   PainSc: 0-No pain                 Pervis Hocking

## 2020-09-21 NOTE — Anesthesia Procedure Notes (Signed)
Procedure Name: Intubation Date/Time: 09/21/2020 7:47 AM Performed by: Niel Hummer, CRNA Pre-anesthesia Checklist: Patient identified, Emergency Drugs available, Suction available and Patient being monitored Patient Re-evaluated:Patient Re-evaluated prior to induction Oxygen Delivery Method: Circle system utilized Preoxygenation: Pre-oxygenation with 100% oxygen Induction Type: IV induction Ventilation: Mask ventilation without difficulty Laryngoscope Size: Mac and 4 Grade View: Grade I Tube type: Oral Tube size: 7.5 mm Number of attempts: 1 Airway Equipment and Method: Stylet Placement Confirmation: ETT inserted through vocal cords under direct vision, positive ETCO2 and breath sounds checked- equal and bilateral Secured at: 22 cm Tube secured with: Tape Dental Injury: Teeth and Oropharynx as per pre-operative assessment

## 2020-09-21 NOTE — Op Note (Signed)
[facility] Patient Name: Joshua Nash Procedure Date: 09/21/2020 MRN: [id number] Attending MD: Rachael Fee , MD Date of Birth: 07-03-1965 CSN: [id number] Age: 55 Admit Type: Outpatient Procedure:                Upper EUS Indications:              Possible distal CBD stricture per [facility]                            liver team review of recent MR, cholestatic                            jaundice, pruritis Providers:                Rachael Fee, MD, Glory Rosebush, RN, Alan Ripper,                            Brion Aliment, Technician Referring MD:             Erick Blinks, MD Medicines:                General Anesthesia Complications:            No immediate complications. Estimated blood loss:                            None. Estimated Blood Loss:     Estimated blood loss: none. Procedure:                Pre-Anesthesia Assessment:                           - Prior to the procedure, a History and Physical                            was performed, and patient medications and                            allergies were reviewed. The patient's tolerance of                            previous anesthesia was also reviewed. The risks                            and benefits of the procedure and the sedation                            options and risks were discussed with the patient.                            All questions were answered, and informed consent                            was obtained. Prior Anticoagulants: The patient has                            taken  no previous anticoagulant or antiplatelet                            agents. ASA Grade Assessment: II - A patient with                            mild systemic disease. After reviewing the risks                            and benefits, the patient was deemed in                            satisfactory condition to undergo the procedure.                           After obtaining informed consent,  the endoscope was                            passed under direct vision. Throughout the                            procedure, the patient's blood pressure, pulse, and                            oxygen saturations were monitored continuously. The                            [id number] ([id number]) Olympus Radial EUS was                            introduced through the mouth, and advanced to the                            second part of duodenum. The upper EUS was                            accomplished without difficulty. The patient                            tolerated the procedure well. Scope In: Scope Out: Findings:      ENDOSCOPIC FINDING (with radial EUS scope and duodenoscope): :      The examined esophagus was endoscopically normal.      The entire examined stomach was endoscopically normal.      The examined duodenum was endoscopically normal (including very good       views of a normal major papilla, surrounded by yellow bile, seen with       side viewing duodenoscope)      ENDOSONOGRAPHIC FINDING: :      1. CBD was normal, non-dilated. Several measurements taken, diameter       ranged from 4.62mm to 5.46mm. No CBD stones or soft tissue nodules.      2. Pancreatic parenchyma was lobular with echogenic strands and foci.       The main pancreatic duct was a bit ectatic but not dilated. The side  walls of main PD were hyperechoic.      3. A few small reactive appearing periportal lymph nodes, none >32mm       across.      4. Limited views of liver, gallbladder, spleen, portal and splenic       vessels were all normal. Impression:               - Normal, non-dilated CBD without stones.                           - A few pancreatic parenchyma and main pancreatic                            duct changes suggestive of chronic pancreatitis. Moderate Sedation:      Not Applicable - Patient had care per Anesthesia. Recommendation:           - ERCP now for further biliary  evaluation. Procedure Code(s):        --- Professional ---                           [id number], Esophagogastroduodenoscopy, flexible,                            transoral; with endoscopic ultrasound examination                            limited to the esophagus, stomach or duodenum, and                            adjacent structures Diagnosis Code(s):        --- Professional ---                           K83.8, Other specified diseases of biliary tract CPT copyright 2019 American Medical Association. All rights reserved. The codes documented in this report are preliminary and upon coder review may  be revised to meet current compliance requirements. Rachael Fee, MD 09/21/2020 9:00:51 AM This report has been signed electronically. Number of Addenda: 0

## 2020-09-21 NOTE — Interval H&P Note (Signed)
History and Physical Interval Note:  09/21/2020 7:20 AM  Joshua Nash  has presented today for surgery, with the diagnosis of elevated alk phos- bile duct stricture-cholestatsis.  The various methods of treatment have been discussed with the patient and family. After consideration of risks, benefits and other options for treatment, the patient has consented to  Procedure(s): ESOPHAGOGASTRODUODENOSCOPY (EGD) WITH PROPOFOL (N/A) UPPER ENDOSCOPIC ULTRASOUND (EUS) RADIAL (N/A) ENDOSCOPIC RETROGRADE CHOLANGIOPANCREATOGRAPHY (ERCP) WITH PROPOFOL (N/A) as a surgical intervention.  The patient's history has been reviewed, patient examined, no change in status, stable for surgery.  I have reviewed the patient's chart and labs.  Questions were answered to the patient's satisfaction.     Milus Banister

## 2020-09-21 NOTE — Transfer of Care (Signed)
Immediate Anesthesia Transfer of Care Note  Patient: Joshua Nash  Procedure(s) Performed: ESOPHAGOGASTRODUODENOSCOPY (EGD) WITH PROPOFOL UPPER ENDOSCOPIC ULTRASOUND (EUS) RADIAL ENDOSCOPIC RETROGRADE CHOLANGIOPANCREATOGRAPHY (ERCP) WITH PROPOFOL SPHINCTEROTOMY BILIARY BRUSHING  Patient Location: PACU  Anesthesia Type:General  Level of Consciousness: awake, alert  and oriented  Airway & Oxygen Therapy: Patient Spontanous Breathing and Patient connected to face mask oxygen  Post-op Assessment: Report given to RN, Post -op Vital signs reviewed and stable and Patient moving all extremities X 4  Post vital signs: Reviewed and stable  Last Vitals:  Vitals Value Taken Time  BP 197/103   Temp    Pulse 85   Resp 25   SpO2 100     Last Pain:  Vitals:   09/21/20 0656  TempSrc: Temporal  PainSc: 0-No pain         Complications: No notable events documented.

## 2020-09-22 LAB — CYTOLOGY - NON PAP

## 2020-09-25 ENCOUNTER — Other Ambulatory Visit: Payer: Self-pay | Admitting: Internal Medicine

## 2020-09-25 DIAGNOSIS — L298 Other pruritus: Secondary | ICD-10-CM

## 2020-09-25 DIAGNOSIS — R7989 Other specified abnormal findings of blood chemistry: Secondary | ICD-10-CM

## 2020-09-25 DIAGNOSIS — K831 Obstruction of bile duct: Secondary | ICD-10-CM

## 2020-09-28 ENCOUNTER — Telehealth: Payer: Self-pay | Admitting: *Deleted

## 2020-09-28 NOTE — Telephone Encounter (Signed)
Buddy Duty is off the rest of this week and next week.

## 2020-09-28 NOTE — Telephone Encounter (Signed)
Called and spoke with patient he was advised to get labs done as soon as possible. He will go on Monday 10/02/20.

## 2020-09-28 NOTE — Telephone Encounter (Signed)
Joshua Nash, [facility] has contacted the patient to advise that he needs repeat labs next week. Patient indicates that he will come on Monday for this.

## 2020-09-28 NOTE — Telephone Encounter (Signed)
-----   Message from Richardson Chiquito, CMA sent at 09/25/2020  2:21 PM EDT ----- Orders placed in Epic for 10/02/20 labs. Remind pt around Thursday, 09/28/20. Patient spanish speaking so will need interpreter. ----- Message ----- From: Beverley Fiedler, MD Sent: 09/25/2020   1:15 PM EDT To: Richardson Chiquito, CMA  Lets get lfts done in about 1 week JMP  ----- Message ----- From: Rachael Fee, MD Sent: 09/21/2020   4:30 PM EDT To: Beverley Fiedler, MD, Lemar Lofty., MD  GM, Thanks.  Certainly IR docs are better than most with these kinds of readings.    Vonna Kotyk, I think if there are any ?s raised by his brushing cytology or next several weeks/months clinical course then repeat ERCP would be a good idea.  Stenting, spylass would be good tools at that point.  DJ  ----- Message ----- From: Lemar Lofty., MD Sent: 09/21/2020   1:30 PM EDT To: Rachael Fee, MD, Beverley Fiedler, MD  DJ, He is one of the new IR MDs.   I agree your tortuosity seems to be what is probably being seen. You already did a cut, so time will tell. If he has some improvement but not completely, a quick stent placement could be done, but I agree overt filling defect seems unlikely. Interested to see those LFTs start coming down. Thanks. GM ----- Message ----- From: Rachael Fee, MD Sent: 09/21/2020  12:35 PM EDT To: Beverley Fiedler, MD, Lemar Lofty., MD  Vonna Kotyk,  Radiology called about his ERCP images.  A doctor that I am not familiar with felt that there was a filling defect in the distal bile duct.  I see what he is talking about however I thought it was more related to tortuosity of the distal bile duct.  There is some overlying loop of small bowel that complicates it as well.  I may be wrong but I had no impression of filling defect on either ERCP or endoscopic ultrasound.  Just wanted to let you know.   Augustin Coupe, Could you give me your take on the ERCP images and my EUS, ERCP reports?   Always nice to bounce things off each other especially when there is a curve ball like above.  Thanks

## 2020-10-02 ENCOUNTER — Other Ambulatory Visit (INDEPENDENT_AMBULATORY_CARE_PROVIDER_SITE_OTHER): Payer: Medicaid Other

## 2020-10-02 DIAGNOSIS — R7989 Other specified abnormal findings of blood chemistry: Secondary | ICD-10-CM

## 2020-10-02 DIAGNOSIS — L298 Other pruritus: Secondary | ICD-10-CM

## 2020-10-02 DIAGNOSIS — K831 Obstruction of bile duct: Secondary | ICD-10-CM | POA: Diagnosis not present

## 2020-10-02 LAB — HEPATIC FUNCTION PANEL
ALT: 54 U/L — ABNORMAL HIGH (ref 0–53)
AST: 32 U/L (ref 0–37)
Albumin: 3.3 g/dL — ABNORMAL LOW (ref 3.5–5.2)
Alkaline Phosphatase: 765 U/L — ABNORMAL HIGH (ref 39–117)
Bilirubin, Direct: 0.3 mg/dL (ref 0.0–0.3)
Total Bilirubin: 0.8 mg/dL (ref 0.2–1.2)
Total Protein: 6.8 g/dL (ref 6.0–8.3)

## 2020-10-05 ENCOUNTER — Telehealth: Payer: Self-pay | Admitting: Internal Medicine

## 2020-10-05 NOTE — Telephone Encounter (Signed)
Noted  

## 2020-10-05 NOTE — Telephone Encounter (Signed)
Called and spoke with patient he has been advise of lab results and referral to [facility]. Patient is aware and has appointment scheduled for 10/09/20 at 11:30 am.
